# Patient Record
Sex: Female | Born: 1950 | Race: White | Hispanic: No | Marital: Married | Smoking: Former smoker
Health system: Southern US, Community
[De-identification: ages and names within clinical notes are randomized; demographics above are authoritative.]

## PROBLEM LIST (undated history)

## (undated) DIAGNOSIS — E7800 Pure hypercholesterolemia, unspecified: Secondary | ICD-10-CM

## (undated) DIAGNOSIS — M5090 Cervical disc disorder, unspecified, unspecified cervical region: Secondary | ICD-10-CM

## (undated) DIAGNOSIS — R51 Headache: Secondary | ICD-10-CM

## (undated) DIAGNOSIS — F419 Anxiety disorder, unspecified: Secondary | ICD-10-CM

## (undated) DIAGNOSIS — I10 Essential (primary) hypertension: Secondary | ICD-10-CM

## (undated) DIAGNOSIS — K219 Gastro-esophageal reflux disease without esophagitis: Secondary | ICD-10-CM

## (undated) DIAGNOSIS — G894 Chronic pain syndrome: Secondary | ICD-10-CM

## (undated) DIAGNOSIS — G8929 Other chronic pain: Secondary | ICD-10-CM

## (undated) DIAGNOSIS — M797 Fibromyalgia: Secondary | ICD-10-CM

## (undated) DIAGNOSIS — J449 Chronic obstructive pulmonary disease, unspecified: Secondary | ICD-10-CM

## (undated) DIAGNOSIS — M1990 Unspecified osteoarthritis, unspecified site: Secondary | ICD-10-CM

## (undated) DIAGNOSIS — R519 Headache, unspecified: Secondary | ICD-10-CM

## (undated) DIAGNOSIS — K589 Irritable bowel syndrome without diarrhea: Secondary | ICD-10-CM

## (undated) HISTORY — DX: Irritable bowel syndrome, unspecified: K58.9

## (undated) HISTORY — DX: Unspecified osteoarthritis, unspecified site: M19.90

## (undated) HISTORY — DX: Gastro-esophageal reflux disease without esophagitis: K21.9

## (undated) HISTORY — DX: Pure hypercholesterolemia, unspecified: E78.00

## (undated) HISTORY — PX: CARPAL TUNNEL RELEASE: SHX101

## (undated) HISTORY — DX: Chronic pain syndrome: G89.4

## (undated) HISTORY — DX: Essential (primary) hypertension: I10

## (undated) HISTORY — DX: Headache, unspecified: R51.9

## (undated) HISTORY — DX: Chronic obstructive pulmonary disease, unspecified: J44.9

## (undated) HISTORY — DX: Fibromyalgia: M79.7

## (undated) HISTORY — DX: Other chronic pain: G89.29

## (undated) HISTORY — DX: Anxiety disorder, unspecified: F41.9

## (undated) HISTORY — DX: Headache: R51

## (undated) HISTORY — DX: Cervical disc disorder, unspecified, unspecified cervical region: M50.90

## (undated) HISTORY — PX: VAGINAL HYSTERECTOMY: SUR661

---

## 1997-08-06 ENCOUNTER — Encounter (HOSPITAL_COMMUNITY): Admission: RE | Admit: 1997-08-06 | Discharge: 1997-11-04 | Payer: Self-pay | Admitting: Internal Medicine

## 1997-08-06 ENCOUNTER — Encounter (HOSPITAL_COMMUNITY): Admission: RE | Admit: 1997-08-06 | Discharge: 1997-08-06 | Payer: Self-pay | Admitting: Internal Medicine

## 1998-12-22 ENCOUNTER — Encounter: Payer: Self-pay | Admitting: Gastroenterology

## 1999-03-09 HISTORY — PX: VESICOVAGINAL FISTULA CLOSURE W/ TAH: SUR271

## 2000-03-08 HISTORY — PX: ANTERIOR CERVICAL DECOMP/DISCECTOMY FUSION: SHX1161

## 2000-03-09 ENCOUNTER — Encounter: Admission: RE | Admit: 2000-03-09 | Discharge: 2000-06-07 | Payer: Self-pay | Admitting: *Deleted

## 2001-02-20 ENCOUNTER — Inpatient Hospital Stay (HOSPITAL_COMMUNITY): Admission: RE | Admit: 2001-02-20 | Discharge: 2001-02-21 | Payer: Self-pay | Admitting: Orthopaedic Surgery

## 2002-02-05 ENCOUNTER — Ambulatory Visit (HOSPITAL_COMMUNITY): Admission: RE | Admit: 2002-02-05 | Discharge: 2002-02-05 | Payer: Self-pay | Admitting: Neurosurgery

## 2002-02-05 ENCOUNTER — Encounter: Payer: Self-pay | Admitting: Neurosurgery

## 2004-02-24 ENCOUNTER — Ambulatory Visit: Payer: Self-pay | Admitting: Pulmonary Disease

## 2004-03-30 ENCOUNTER — Ambulatory Visit: Payer: Self-pay | Admitting: Pulmonary Disease

## 2004-06-17 ENCOUNTER — Ambulatory Visit: Payer: Self-pay | Admitting: Pulmonary Disease

## 2004-08-10 ENCOUNTER — Ambulatory Visit: Payer: Self-pay | Admitting: Pulmonary Disease

## 2004-11-13 ENCOUNTER — Ambulatory Visit: Payer: Self-pay | Admitting: Pulmonary Disease

## 2004-12-24 ENCOUNTER — Ambulatory Visit: Payer: Self-pay | Admitting: Pulmonary Disease

## 2005-01-05 ENCOUNTER — Ambulatory Visit: Payer: Self-pay | Admitting: Gastroenterology

## 2005-01-07 ENCOUNTER — Ambulatory Visit: Payer: Self-pay | Admitting: Gastroenterology

## 2005-01-26 ENCOUNTER — Ambulatory Visit (HOSPITAL_COMMUNITY): Admission: RE | Admit: 2005-01-26 | Discharge: 2005-01-26 | Payer: Self-pay | Admitting: Pulmonary Disease

## 2005-04-23 ENCOUNTER — Ambulatory Visit: Payer: Self-pay | Admitting: Pulmonary Disease

## 2005-12-08 ENCOUNTER — Ambulatory Visit: Payer: Self-pay | Admitting: Pulmonary Disease

## 2005-12-14 ENCOUNTER — Ambulatory Visit: Payer: Self-pay | Admitting: Pulmonary Disease

## 2005-12-14 LAB — CONVERTED CEMR LAB
BUN: 7 mg/dL (ref 6–23)
CO2: 32 meq/L (ref 19–32)
Calcium: 9.1 mg/dL (ref 8.4–10.5)
Creatinine, Ser: 0.8 mg/dL (ref 0.4–1.2)

## 2006-06-09 ENCOUNTER — Ambulatory Visit: Payer: Self-pay | Admitting: Pulmonary Disease

## 2006-06-09 LAB — CONVERTED CEMR LAB
AST: 27 units/L (ref 0–37)
Albumin: 3.7 g/dL (ref 3.5–5.2)
Bilirubin, Direct: 0.1 mg/dL (ref 0.0–0.3)
Chloride: 93 meq/L — ABNORMAL LOW (ref 96–112)
Cholesterol: 173 mg/dL (ref 0–200)
Creatinine, Ser: 0.8 mg/dL (ref 0.4–1.2)
GFR calc non Af Amer: 78 mL/min
Glucose, Bld: 102 mg/dL — ABNORMAL HIGH (ref 70–99)
HDL: 63 mg/dL (ref >39.0)
LDL Cholesterol: 71 mg/dL (ref 0–99)
Potassium: 5.4 meq/L — ABNORMAL HIGH (ref 3.5–5.1)
Sodium: 132 meq/L — ABNORMAL LOW (ref 135–145)
Total Bilirubin: 0.5 mg/dL (ref 0.3–1.2)
Triglycerides: 194 mg/dL — ABNORMAL HIGH (ref 0–149)
VLDL: 39 mg/dL (ref 0–40)

## 2007-01-16 ENCOUNTER — Ambulatory Visit: Payer: Self-pay | Admitting: Pulmonary Disease

## 2007-01-16 LAB — CONVERTED CEMR LAB
Albumin: 3.7 g/dL (ref 3.5–5.2)
Alkaline Phosphatase: 50 units/L (ref 39–117)
BUN: 9 mg/dL (ref 6–23)
Basophils Absolute: 0 10*3/uL (ref 0.0–0.1)
Cholesterol: 124 mg/dL (ref 0–200)
Creatinine, Ser: 0.7 mg/dL (ref 0.4–1.2)
Eosinophils Absolute: 0.1 10*3/uL (ref 0.0–0.6)
GFR calc Af Amer: 111 mL/min
GFR calc non Af Amer: 91 mL/min
HCT: 41.4 % (ref 36.0–46.0)
HDL: 49.6 mg/dL (ref >39.0)
Hemoglobin: 14.1 g/dL (ref 12.0–15.0)
LDL Cholesterol: 53 mg/dL (ref 0–99)
MCHC: 34.1 g/dL (ref 30.0–36.0)
MCV: 96 fL (ref 78.0–100.0)
Monocytes Absolute: 0.6 10*3/uL (ref 0.2–0.7)
Monocytes Relative: 8.5 % (ref 3.0–11.0)
Neutro Abs: 3.6 10*3/uL (ref 1.4–7.7)
Neutrophils Relative %: 53.5 % (ref 43.0–77.0)
Potassium: 4.8 meq/L (ref 3.5–5.1)
Sodium: 134 meq/L — ABNORMAL LOW (ref 135–145)
TSH: 1.07 microintl units/mL (ref 0.35–5.50)
Total Bilirubin: 0.6 mg/dL (ref 0.3–1.2)

## 2007-01-19 DIAGNOSIS — K219 Gastro-esophageal reflux disease without esophagitis: Secondary | ICD-10-CM

## 2007-01-19 DIAGNOSIS — G894 Chronic pain syndrome: Secondary | ICD-10-CM | POA: Insufficient documentation

## 2007-01-19 DIAGNOSIS — I10 Essential (primary) hypertension: Secondary | ICD-10-CM | POA: Insufficient documentation

## 2007-01-19 DIAGNOSIS — E7800 Pure hypercholesterolemia, unspecified: Secondary | ICD-10-CM

## 2007-01-31 ENCOUNTER — Ambulatory Visit: Payer: Self-pay | Admitting: Pulmonary Disease

## 2007-06-20 ENCOUNTER — Telehealth (INDEPENDENT_AMBULATORY_CARE_PROVIDER_SITE_OTHER): Payer: Self-pay | Admitting: *Deleted

## 2007-07-20 ENCOUNTER — Encounter: Payer: Self-pay | Admitting: Pulmonary Disease

## 2007-07-21 ENCOUNTER — Ambulatory Visit: Payer: Self-pay | Admitting: Pulmonary Disease

## 2007-07-21 DIAGNOSIS — R519 Headache, unspecified: Secondary | ICD-10-CM | POA: Insufficient documentation

## 2007-07-21 DIAGNOSIS — M797 Fibromyalgia: Secondary | ICD-10-CM

## 2007-07-21 DIAGNOSIS — F411 Generalized anxiety disorder: Secondary | ICD-10-CM

## 2007-07-21 DIAGNOSIS — M1990 Unspecified osteoarthritis, unspecified site: Secondary | ICD-10-CM | POA: Insufficient documentation

## 2007-07-21 DIAGNOSIS — J449 Chronic obstructive pulmonary disease, unspecified: Secondary | ICD-10-CM

## 2007-07-21 DIAGNOSIS — M5030 Other cervical disc degeneration, unspecified cervical region: Secondary | ICD-10-CM

## 2007-07-21 DIAGNOSIS — K589 Irritable bowel syndrome without diarrhea: Secondary | ICD-10-CM

## 2007-07-21 DIAGNOSIS — R51 Headache: Secondary | ICD-10-CM

## 2007-08-08 ENCOUNTER — Encounter: Payer: Self-pay | Admitting: Pulmonary Disease

## 2007-08-10 ENCOUNTER — Telehealth (INDEPENDENT_AMBULATORY_CARE_PROVIDER_SITE_OTHER): Payer: Self-pay | Admitting: *Deleted

## 2007-08-16 ENCOUNTER — Telehealth (INDEPENDENT_AMBULATORY_CARE_PROVIDER_SITE_OTHER): Payer: Self-pay | Admitting: *Deleted

## 2007-09-29 ENCOUNTER — Telehealth (INDEPENDENT_AMBULATORY_CARE_PROVIDER_SITE_OTHER): Payer: Self-pay | Admitting: *Deleted

## 2007-10-17 ENCOUNTER — Encounter: Payer: Self-pay | Admitting: Pulmonary Disease

## 2007-10-27 ENCOUNTER — Telehealth: Payer: Self-pay | Admitting: Pulmonary Disease

## 2007-12-07 ENCOUNTER — Telehealth (INDEPENDENT_AMBULATORY_CARE_PROVIDER_SITE_OTHER): Payer: Self-pay | Admitting: *Deleted

## 2008-02-22 ENCOUNTER — Encounter: Payer: Self-pay | Admitting: Adult Health

## 2008-02-22 ENCOUNTER — Ambulatory Visit: Payer: Self-pay | Admitting: Pulmonary Disease

## 2008-02-28 LAB — CONVERTED CEMR LAB
ALT: 19 units/L (ref 0–35)
AST: 30 units/L (ref 0–37)
Alkaline Phosphatase: 61 units/L (ref 39–117)
Bilirubin Urine: NEGATIVE
Bilirubin, Direct: 0.1 mg/dL (ref 0.0–0.3)
CO2: 36 meq/L — ABNORMAL HIGH (ref 19–32)
Calcium: 8.7 mg/dL (ref 8.4–10.5)
Chloride: 92 meq/L — ABNORMAL LOW (ref 96–112)
Cholesterol: 166 mg/dL (ref 0–200)
Glucose, Bld: 90 mg/dL (ref 70–99)
Hemoglobin: 15 g/dL (ref 12.0–15.0)
Leukocytes, UA: NEGATIVE
Lymphocytes Relative: 35.7 % (ref 12.0–46.0)
Monocytes Relative: 7.6 % (ref 3.0–12.0)
Neutro Abs: 3.3 10*3/uL (ref 1.4–7.7)
Neutrophils Relative %: 55.2 % (ref 43.0–77.0)
Nitrite: NEGATIVE
Potassium: 5.2 meq/L — ABNORMAL HIGH (ref 3.5–5.1)
RDW: 12.9 % (ref 11.5–14.6)
Sodium: 132 meq/L — ABNORMAL LOW (ref 135–145)
Specific Gravity, Urine: 1.01 (ref 1.000–1.03)
Total Bilirubin: 0.5 mg/dL (ref 0.3–1.2)
Total Protein: 6.5 g/dL (ref 6.0–8.3)
Triglycerides: 198 mg/dL — ABNORMAL HIGH (ref 0–149)
Urobilinogen, UA: 0.2 (ref 0.0–1.0)
VLDL: 40 mg/dL (ref 0–40)
pH: 5.5 (ref 5.0–8.0)

## 2008-03-12 ENCOUNTER — Telehealth (INDEPENDENT_AMBULATORY_CARE_PROVIDER_SITE_OTHER): Payer: Self-pay | Admitting: *Deleted

## 2008-04-02 ENCOUNTER — Encounter: Payer: Self-pay | Admitting: Adult Health

## 2008-04-02 ENCOUNTER — Ambulatory Visit: Payer: Self-pay | Admitting: Internal Medicine

## 2008-04-02 LAB — CONVERTED CEMR LAB
BUN: 7 mg/dL (ref 6–23)
CO2: 34 meq/L — ABNORMAL HIGH (ref 19–32)
Calcium: 9 mg/dL (ref 8.4–10.5)
Chloride: 93 meq/L — ABNORMAL LOW (ref 96–112)
Creatinine, Ser: 0.6 mg/dL (ref 0.4–1.2)
Glucose, Bld: 98 mg/dL (ref 70–99)

## 2008-04-04 ENCOUNTER — Telehealth (INDEPENDENT_AMBULATORY_CARE_PROVIDER_SITE_OTHER): Payer: Self-pay | Admitting: *Deleted

## 2008-05-09 ENCOUNTER — Telehealth (INDEPENDENT_AMBULATORY_CARE_PROVIDER_SITE_OTHER): Payer: Self-pay | Admitting: *Deleted

## 2008-06-19 ENCOUNTER — Ambulatory Visit: Payer: Self-pay | Admitting: Pulmonary Disease

## 2008-06-20 DIAGNOSIS — E559 Vitamin D deficiency, unspecified: Secondary | ICD-10-CM | POA: Insufficient documentation

## 2008-08-02 ENCOUNTER — Telehealth: Payer: Self-pay | Admitting: Pulmonary Disease

## 2008-08-06 ENCOUNTER — Telehealth (INDEPENDENT_AMBULATORY_CARE_PROVIDER_SITE_OTHER): Payer: Self-pay | Admitting: *Deleted

## 2008-08-07 ENCOUNTER — Telehealth (INDEPENDENT_AMBULATORY_CARE_PROVIDER_SITE_OTHER): Payer: Self-pay | Admitting: *Deleted

## 2008-08-12 ENCOUNTER — Ambulatory Visit: Payer: Self-pay | Admitting: Pulmonary Disease

## 2008-08-13 LAB — CONVERTED CEMR LAB
BUN: 5 mg/dL — ABNORMAL LOW (ref 6–23)
CO2: 37 meq/L — ABNORMAL HIGH (ref 19–32)
Calcium: 8.5 mg/dL (ref 8.4–10.5)
GFR calc non Af Amer: 133.62 mL/min (ref >60)
Glucose, Bld: 81 mg/dL (ref 70–99)

## 2008-10-25 ENCOUNTER — Telehealth (INDEPENDENT_AMBULATORY_CARE_PROVIDER_SITE_OTHER): Payer: Self-pay | Admitting: *Deleted

## 2008-11-25 ENCOUNTER — Telehealth: Payer: Self-pay | Admitting: Pulmonary Disease

## 2008-11-29 ENCOUNTER — Telehealth (INDEPENDENT_AMBULATORY_CARE_PROVIDER_SITE_OTHER): Payer: Self-pay | Admitting: *Deleted

## 2008-12-23 ENCOUNTER — Ambulatory Visit: Payer: Self-pay | Admitting: Pulmonary Disease

## 2008-12-26 ENCOUNTER — Telehealth: Payer: Self-pay | Admitting: Pulmonary Disease

## 2009-01-02 ENCOUNTER — Telehealth (INDEPENDENT_AMBULATORY_CARE_PROVIDER_SITE_OTHER): Payer: Self-pay | Admitting: *Deleted

## 2009-01-03 ENCOUNTER — Encounter: Payer: Self-pay | Admitting: Pulmonary Disease

## 2009-01-03 ENCOUNTER — Ambulatory Visit: Payer: Self-pay | Admitting: Cardiovascular Disease

## 2009-01-03 ENCOUNTER — Ambulatory Visit: Payer: Self-pay

## 2009-01-03 ENCOUNTER — Ambulatory Visit (HOSPITAL_COMMUNITY): Admission: RE | Admit: 2009-01-03 | Discharge: 2009-01-03 | Payer: Self-pay | Admitting: Pulmonary Disease

## 2009-01-07 ENCOUNTER — Telehealth: Payer: Self-pay | Admitting: Pulmonary Disease

## 2009-01-09 ENCOUNTER — Telehealth (INDEPENDENT_AMBULATORY_CARE_PROVIDER_SITE_OTHER): Payer: Self-pay | Admitting: *Deleted

## 2009-01-23 ENCOUNTER — Telehealth: Payer: Self-pay | Admitting: Pulmonary Disease

## 2009-03-20 ENCOUNTER — Ambulatory Visit: Payer: Self-pay | Admitting: Pulmonary Disease

## 2009-03-27 LAB — CONVERTED CEMR LAB
ALT: 20 units/L (ref 0–35)
AST: 27 units/L (ref 0–37)
Alkaline Phosphatase: 61 units/L (ref 39–117)
BUN: 17 mg/dL (ref 6–23)
Basophils Absolute: 0 10*3/uL (ref 0.0–0.1)
Chloride: 92 meq/L — ABNORMAL LOW (ref 96–112)
Eosinophils Absolute: 0.2 10*3/uL (ref 0.0–0.7)
GFR calc non Af Amer: 53.68 mL/min (ref >60)
Glucose, Bld: 93 mg/dL (ref 70–99)
HCT: 47 % — ABNORMAL HIGH (ref 36.0–46.0)
HDL: 54.5 mg/dL (ref >39.00)
Lymphs Abs: 1.9 10*3/uL (ref 0.7–4.0)
MCV: 102.7 fL — ABNORMAL HIGH (ref 78.0–100.0)
Monocytes Absolute: 0.6 10*3/uL (ref 0.1–1.0)
Neutrophils Relative %: 64 % (ref 43.0–77.0)
Platelets: 170 10*3/uL (ref 150.0–400.0)
Potassium: 5.2 meq/L — ABNORMAL HIGH (ref 3.5–5.1)
Pro B Natriuretic peptide (BNP): 89 pg/mL (ref 0.0–100.0)
RDW: 12.7 % (ref 11.5–14.6)
Sodium: 132 meq/L — ABNORMAL LOW (ref 135–145)
TSH: 2.98 microintl units/mL (ref 0.35–5.50)
Total Bilirubin: 1.1 mg/dL (ref 0.3–1.2)
VLDL: 45 mg/dL — ABNORMAL HIGH (ref 0.0–40.0)
WBC: 7.3 10*3/uL (ref 4.5–10.5)

## 2009-03-31 ENCOUNTER — Telehealth (INDEPENDENT_AMBULATORY_CARE_PROVIDER_SITE_OTHER): Payer: Self-pay | Admitting: *Deleted

## 2009-04-07 ENCOUNTER — Encounter: Payer: Self-pay | Admitting: Pulmonary Disease

## 2009-04-17 ENCOUNTER — Ambulatory Visit: Payer: Self-pay | Admitting: Emergency Medicine

## 2009-04-17 ENCOUNTER — Telehealth (INDEPENDENT_AMBULATORY_CARE_PROVIDER_SITE_OTHER): Payer: Self-pay | Admitting: *Deleted

## 2009-04-17 DIAGNOSIS — I2723 Pulmonary hypertension due to lung diseases and hypoxia: Secondary | ICD-10-CM

## 2009-04-17 DIAGNOSIS — J449 Chronic obstructive pulmonary disease, unspecified: Secondary | ICD-10-CM

## 2009-04-18 ENCOUNTER — Telehealth: Payer: Self-pay | Admitting: Emergency Medicine

## 2009-04-21 LAB — CONVERTED CEMR LAB: Scleroderma (Scl-70) (ENA) Antibody, IgG: <0.2 (ref <1.0)

## 2009-04-23 ENCOUNTER — Encounter: Payer: Self-pay | Admitting: Pulmonary Disease

## 2009-04-23 ENCOUNTER — Telehealth (INDEPENDENT_AMBULATORY_CARE_PROVIDER_SITE_OTHER): Payer: Self-pay | Admitting: *Deleted

## 2009-04-23 ENCOUNTER — Ambulatory Visit: Payer: Self-pay | Admitting: Emergency Medicine

## 2009-04-24 ENCOUNTER — Encounter: Payer: Self-pay | Admitting: Internal Medicine

## 2009-04-24 ENCOUNTER — Encounter: Payer: Self-pay | Admitting: Emergency Medicine

## 2009-04-25 ENCOUNTER — Telehealth: Payer: Self-pay | Admitting: Internal Medicine

## 2009-04-28 ENCOUNTER — Telehealth (INDEPENDENT_AMBULATORY_CARE_PROVIDER_SITE_OTHER): Payer: Self-pay | Admitting: *Deleted

## 2009-05-01 ENCOUNTER — Telehealth: Payer: Self-pay | Admitting: Pulmonary Disease

## 2009-05-02 ENCOUNTER — Telehealth (INDEPENDENT_AMBULATORY_CARE_PROVIDER_SITE_OTHER): Payer: Self-pay | Admitting: *Deleted

## 2009-05-05 ENCOUNTER — Telehealth: Payer: Self-pay | Admitting: Pulmonary Disease

## 2009-05-09 ENCOUNTER — Telehealth (INDEPENDENT_AMBULATORY_CARE_PROVIDER_SITE_OTHER): Payer: Self-pay | Admitting: *Deleted

## 2009-05-19 ENCOUNTER — Encounter: Payer: Self-pay | Admitting: Emergency Medicine

## 2009-05-27 ENCOUNTER — Ambulatory Visit: Payer: Self-pay | Admitting: Emergency Medicine

## 2009-06-18 ENCOUNTER — Telehealth (INDEPENDENT_AMBULATORY_CARE_PROVIDER_SITE_OTHER): Payer: Self-pay | Admitting: *Deleted

## 2009-06-30 ENCOUNTER — Telehealth (INDEPENDENT_AMBULATORY_CARE_PROVIDER_SITE_OTHER): Payer: Self-pay | Admitting: *Deleted

## 2009-07-11 ENCOUNTER — Ambulatory Visit: Payer: Self-pay | Admitting: Pulmonary Disease

## 2009-07-13 LAB — CONVERTED CEMR LAB
Basophils Relative: 0.4 % (ref 0.0–3.0)
CO2: 34 meq/L — ABNORMAL HIGH (ref 19–32)
Calcium: 9.2 mg/dL (ref 8.4–10.5)
GFR calc non Af Amer: 100.19 mL/min (ref >60)
Hemoglobin: 14.9 g/dL (ref 12.0–15.0)
Lymphocytes Relative: 34.8 % (ref 12.0–46.0)
Monocytes Relative: 9.2 % (ref 3.0–12.0)
Neutro Abs: 3.8 10*3/uL (ref 1.4–7.7)
Potassium: 4.9 meq/L (ref 3.5–5.1)
RBC: 4.63 M/uL (ref 3.87–5.11)
Sodium: 134 meq/L — ABNORMAL LOW (ref 135–145)

## 2009-07-16 ENCOUNTER — Encounter: Admission: RE | Admit: 2009-07-16 | Discharge: 2009-07-16 | Payer: Self-pay | Admitting: Pulmonary Disease

## 2009-07-21 ENCOUNTER — Telehealth (INDEPENDENT_AMBULATORY_CARE_PROVIDER_SITE_OTHER): Payer: Self-pay | Admitting: *Deleted

## 2009-07-28 ENCOUNTER — Telehealth: Payer: Self-pay | Admitting: Pulmonary Disease

## 2009-08-17 ENCOUNTER — Ambulatory Visit (HOSPITAL_BASED_OUTPATIENT_CLINIC_OR_DEPARTMENT_OTHER): Admission: RE | Admit: 2009-08-17 | Discharge: 2009-08-17 | Payer: Self-pay | Admitting: Pulmonary Disease

## 2009-08-17 ENCOUNTER — Encounter: Payer: Self-pay | Admitting: Pulmonary Disease

## 2009-08-31 ENCOUNTER — Ambulatory Visit: Payer: Self-pay | Admitting: Pulmonary Disease

## 2009-09-01 ENCOUNTER — Ambulatory Visit: Payer: Self-pay | Admitting: Pulmonary Disease

## 2009-09-01 ENCOUNTER — Telehealth: Payer: Self-pay | Admitting: Internal Medicine

## 2009-09-11 ENCOUNTER — Encounter: Payer: Self-pay | Admitting: Internal Medicine

## 2009-09-30 ENCOUNTER — Ambulatory Visit: Payer: Self-pay | Admitting: Internal Medicine

## 2009-09-30 LAB — CONVERTED CEMR LAB
BUN: 14 mg/dL (ref 6–23)
Basophils Relative: 0.4 % (ref 0.0–3.0)
CO2: 35 meq/L — ABNORMAL HIGH (ref 19–32)
Chloride: 97 meq/L (ref 96–112)
Creatinine, Ser: 0.7 mg/dL (ref 0.4–1.2)
Eosinophils Absolute: 0.2 10*3/uL (ref 0.0–0.7)
Glucose, Bld: 126 mg/dL — ABNORMAL HIGH (ref 70–99)
MCHC: 34.3 g/dL (ref 30.0–36.0)
MCV: 95.6 fL (ref 78.0–100.0)
Monocytes Absolute: 0.4 10*3/uL (ref 0.1–1.0)
Neutrophils Relative %: 58.8 % (ref 43.0–77.0)
Platelets: 255 10*3/uL (ref 150.0–400.0)
RBC: 4.2 M/uL (ref 3.87–5.11)

## 2009-10-03 ENCOUNTER — Ambulatory Visit: Payer: Self-pay | Admitting: Internal Medicine

## 2009-10-03 ENCOUNTER — Encounter: Payer: Self-pay | Admitting: Pulmonary Disease

## 2009-10-03 ENCOUNTER — Inpatient Hospital Stay (HOSPITAL_BASED_OUTPATIENT_CLINIC_OR_DEPARTMENT_OTHER): Admission: RE | Admit: 2009-10-03 | Discharge: 2009-10-03 | Payer: Self-pay | Admitting: Internal Medicine

## 2009-10-03 ENCOUNTER — Telehealth (INDEPENDENT_AMBULATORY_CARE_PROVIDER_SITE_OTHER): Payer: Self-pay | Admitting: *Deleted

## 2009-10-09 ENCOUNTER — Ambulatory Visit: Payer: Self-pay | Admitting: Emergency Medicine

## 2009-10-14 ENCOUNTER — Telehealth (INDEPENDENT_AMBULATORY_CARE_PROVIDER_SITE_OTHER): Payer: Self-pay | Admitting: *Deleted

## 2009-10-15 ENCOUNTER — Telehealth: Payer: Self-pay | Admitting: Pulmonary Disease

## 2009-10-15 ENCOUNTER — Telehealth (INDEPENDENT_AMBULATORY_CARE_PROVIDER_SITE_OTHER): Payer: Self-pay | Admitting: *Deleted

## 2009-10-24 ENCOUNTER — Telehealth (INDEPENDENT_AMBULATORY_CARE_PROVIDER_SITE_OTHER): Payer: Self-pay | Admitting: *Deleted

## 2009-11-12 ENCOUNTER — Telehealth (INDEPENDENT_AMBULATORY_CARE_PROVIDER_SITE_OTHER): Payer: Self-pay | Admitting: *Deleted

## 2009-12-23 ENCOUNTER — Telehealth (INDEPENDENT_AMBULATORY_CARE_PROVIDER_SITE_OTHER): Payer: Self-pay | Admitting: *Deleted

## 2009-12-24 ENCOUNTER — Encounter: Payer: Self-pay | Admitting: Emergency Medicine

## 2009-12-26 ENCOUNTER — Telehealth (INDEPENDENT_AMBULATORY_CARE_PROVIDER_SITE_OTHER): Payer: Self-pay | Admitting: *Deleted

## 2010-01-07 ENCOUNTER — Telehealth (INDEPENDENT_AMBULATORY_CARE_PROVIDER_SITE_OTHER): Payer: Self-pay | Admitting: *Deleted

## 2010-01-20 ENCOUNTER — Encounter: Payer: Self-pay | Admitting: Pulmonary Disease

## 2010-01-23 ENCOUNTER — Ambulatory Visit: Payer: Self-pay | Admitting: Emergency Medicine

## 2010-01-23 ENCOUNTER — Telehealth: Payer: Self-pay | Admitting: Adult Health

## 2010-01-23 DIAGNOSIS — J209 Acute bronchitis, unspecified: Secondary | ICD-10-CM | POA: Insufficient documentation

## 2010-02-04 ENCOUNTER — Telehealth: Payer: Self-pay | Admitting: Pulmonary Disease

## 2010-02-06 ENCOUNTER — Telehealth: Payer: Self-pay | Admitting: Pulmonary Disease

## 2010-02-26 ENCOUNTER — Telehealth (INDEPENDENT_AMBULATORY_CARE_PROVIDER_SITE_OTHER): Payer: Self-pay | Admitting: *Deleted

## 2010-03-25 ENCOUNTER — Ambulatory Visit
Admission: RE | Admit: 2010-03-25 | Discharge: 2010-03-25 | Payer: Self-pay | Source: Home / Self Care | Attending: Pulmonary Disease | Admitting: Pulmonary Disease

## 2010-03-27 ENCOUNTER — Telehealth: Payer: Self-pay | Admitting: Pulmonary Disease

## 2010-03-28 ENCOUNTER — Encounter: Payer: Self-pay | Admitting: Pulmonary Disease

## 2010-03-29 ENCOUNTER — Encounter: Payer: Self-pay | Admitting: Neurosurgery

## 2010-04-06 ENCOUNTER — Ambulatory Visit: Admit: 2010-04-06 | Payer: Self-pay | Admitting: Pulmonary Disease

## 2010-04-07 NOTE — Letter (Signed)
Summary: [facility]  [facility]N/[facility]   Imported By: Bubba Hales 04/29/2009 09:56:45  _____________________________________________________________________  External Attachment:    Type:   Image     Comment:   External Document

## 2010-04-07 NOTE — Progress Notes (Signed)
Summary: rx  Phone Note Call from Patient Call back at Home Phone [phone]   Caller: Patient Call For: Katianne Barre Reason for Call: Talk to Nurse Summary of Call: pt said that Vicodin was what was called in  5/553m.  Tablet is smaller and white, instead if Hydrocodone 10/6554m- Is this what SN wants her to take? Initial call taken by: AnZigmund Gottron May 05, 2009 4:25 PM  Follow-up for Phone Call        Spoke with the pt pharmacy and they staed what they pulled off the voicemail was 5/500. I advised next refill should be 10-650. they have corrected this. Pt already picked up Vicodin 5-500 so I advised to take thsi med until gone and then she can goback to 10-650. Advised if there was any problems with med to  call. JeRandolph BingMA  May 05, 2009 4:40 PM'

## 2010-04-07 NOTE — Medication Information (Signed)
Summary: Omeprazole/[facility]  Omeprazole/[facility]   Imported By: Phillis Knack 04/10/2009 13:30:58  _____________________________________________________________________  External Attachment:    Type:   Image     Comment:   External Document

## 2010-04-07 NOTE — Miscellaneous (Signed)
Summary: Orders Update   Clinical Lists Changes  Orders: Added new Service order of Est. Patient Level IV (49971) - Signed

## 2010-04-07 NOTE — Progress Notes (Signed)
Summary: pt has an apt today/ wants to talk to TD  Phone Note Call from Patient Call back at Home Phone [phone]   Caller: Patient Call For: byrum Summary of Call: patient wants to talk to tammy davis about what her apt was about when she seen dr byrum. she said she doesnt understand anything.  Initial call taken by: Adin Hector,  April 23, 2009 12:26 PM  Follow-up for Phone Call        [facility] Tilden Dome  April 23, 2009 1:36 PM   Additional Follow-up for Phone Call Additional follow up Details #1::        Pt came in for PFT-spoke with pt about her concerns for Cath; called and spoke with Nira Conn regarding this matter. Setting up appt.Pound  April 23, 2009 3:52 PM

## 2010-04-07 NOTE — Progress Notes (Signed)
Summary: prescript  Phone Note Call from Patient   Caller: Patient Call For: nadel Summary of Call: need refill for albuterol sulfate [facility] Initial call taken by: Gustavus Bryant,  June 18, 2009 4:14 PM  Follow-up for Phone Call        called spoke with patient. advised rx has been sent to her pharmacy. Parke Poisson CNA  June 18, 2009 4:17 PM     Prescriptions: PROAIR HFA 108 (90 BASE) MCG/ACT  AERS (ALBUTEROL SULFATE) inhale 2 puffs every 4 hours as needed  #1 x 6   Entered by:   Parke Poisson CNA   Authorized by:   Noralee Space MD   Signed by:   Parke Poisson CNA on 06/18/2009   Method used:   Electronically to        [facility] (retail)       [address]       [place], [place]  [address]       Ph: [phone]       Fax: [phone]   RxID:   [id number]

## 2010-04-07 NOTE — Assessment & Plan Note (Signed)
Summary: Acute NP office visit - bronchitis   Primary Provider/Referring Provider:  Dr. Teressa Lower  CC:  prod cough with brown mucus, chest congestion, and wheezing x1.5weeks.  History of Present Illness: 60 y/o WF, positive tobacco and COPD, anxiety d/o, chronic pain,  history of HTN, and hyperlipidemia, and anxiety, chronic reflux. Seen in past by Dr Doreatha Lew 2002 and found to have PASP  ~ 45-50 mmHg on TTE. Repeat TTE with estimated PASP 70 mmHg. She does report exposure to dexfenfluromine for about 3 months (was not on fen-phen) in early 2000's (she can't remember when). ANA negative.   ROS - has been very active, no real exertional SOB. She does c/o esophageal spasms, GERD symptoms. Still smokes and takes Advair, feels that it helps her. Denies any CP, no syncope of pre-syncope.   ROV 05/27/09 -- PFT's 04/24/09 showed severe obstruction + probable restriction. She never wore her supplimental O2 because she felt better.  She cancelled her R heart cath. Continues to take Advair. She has cut her cigarettes down to 1 -2 cigarettes a day. We rechecked autoimmune labs - ANA, aScl70, a-ds-DNA negative. RF borderline positive. She mentions today that she had formerly been adderal, stopped it many mo ago, wonders if this had anything to do with her breathing problems. She feels that her exertional tolerance is good, not sure she needs O2 or a R heart cath.    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 Since last visit, she has decided to undergo R heart cath (7/29) showing mean PAP 41, PAOP 10. She quit smoking in May 2011! Tells me that she is using O2 with some exertion but not all. She doesn't feel very limited by her breathing. She is able to do most activities, but has curtailed some things - does chores but slowly, some SOB with climbing several flights of stairs.   January 23, 2010--Presents for an acute office visit. Complains of productive cough with  brown mucus, chest congestion, wheezing  over last 2 weeks . OTC not helping. Denies chest pain,  orthopnea, hemoptysis, fever, n/v/d, edema, headache.   Medications Prior to Update: 1)  Oxygen .... At Bedtime 2)  Advair Diskus 250-50 Mcg/dose  Misc (Fluticasone-Salmeterol) .... Take One Puff Two Times A Day  Rinse Mouth After Each Use 3)  Spiriva Handihaler 18 Mcg Caps (Tiotropium Bromide Monohydrate) .... Inhale Contents of One Capsule Daily Via Handihaler. 4)  Mucinex Maximum Strength 1200 Mg Xr12h-Tab (Guaifenesin) .... Take 1 Tab By Mouth Two Times A Day W/ Plenty of Fluids.Marland KitchenMarland Kitchen 5)  Atenolol 50 Mg  Tabs (Atenolol) .... Take 1 Tablet By Mouth Once A Day... 6)  Vytorin 10-80 Mg Tabs (Ezetimibe-Simvastatin) .... Take One Tablet By Mouth At Bedtime 7)  Omeprazole 20 Mg Cpdr (Omeprazole) .Marland Kitchen.. 1 Once Daily 8)  Premarin 0.625 Mg  Tabs (Estrogens Conjugated) .... Take 1 Tablet By Mouth Once A Day 9)  Hydrocodone-Acetaminophen 10-650 Mg  Tabs (Hydrocodone-Acetaminophen) .... Take 1 Tablet Every 6 Hrs As Needed Pain, Not To Exceed 4 Tabs Per Day. (No Early Refills). 10)  Norpramin 100 Mg Tabs (Desipramine Hcl) .Marland Kitchen.. 1 By Mouth At Bedtime 11)  Alprazolam 0.5 Mg  Tabs (Alprazolam) .... Take 1/2 To 1 Tab By Mouth Three Times A Day, Not To Exceed 3 Pills Per Day. 12)  Ambien 10 Mg  Tabs (Zolpidem Tartrate) .... Take 1/2 To 1 Tab By Mouth At Bedtime As Needed For Sleep 13)  Retin-A  0.025 % Crea (Tretinoin) .... Apply As Directed 14)  Augmentin 875-125 Mg Tabs (Amoxicillin-Pot Clavulanate) .... Take 1 Tab By Mouth Two Times A Day Til Gone... 15)  Proair Hfa 108 (90 Base) Mcg/act  Aers (Albuterol Sulfate) .... Inhale 2 Puffs Every 4 Hours As Needed  Current Medications (verified): 1)  Oxygen .... At Bedtime 2)  Advair Diskus 250-50 Mcg/dose  Misc (Fluticasone-Salmeterol) .... Take One Puff Two Times A Day  Rinse Mouth After Each Use 3)  Spiriva Handihaler 18 Mcg Caps (Tiotropium Bromide Monohydrate) ....  Inhale Contents of One Capsule Daily Via Handihaler. 4)  Proair Hfa 108 (90 Base) Mcg/act  Aers (Albuterol Sulfate) .... Inhale 2 Puffs Every 4 Hours As Needed 5)  Mucinex Maximum Strength 1200 Mg Xr12h-Tab (Guaifenesin) .... Take 1 Tab By Mouth Two Times A Day W/ Plenty of Fluids.Marland KitchenMarland Kitchen 6)  Atenolol 50 Mg  Tabs (Atenolol) .... Take 1 Tablet By Mouth Once A Day... 7)  Vytorin 10-80 Mg Tabs (Ezetimibe-Simvastatin) .... Take One Tablet By Mouth At Bedtime 8)  Omeprazole 20 Mg Cpdr (Omeprazole) .Marland Kitchen.. 1 Once Daily 9)  Premarin 0.625 Mg  Tabs (Estrogens Conjugated) .... Take 1 Tablet By Mouth Once A Day 10)  Hydrocodone-Acetaminophen 10-650 Mg  Tabs (Hydrocodone-Acetaminophen) .... Take 1 Tablet Every 6 Hrs As Needed Pain, Not To Exceed 4 Tabs Per Day. (No Early Refills). 11)  Norpramin 100 Mg Tabs (Desipramine Hcl) .Marland Kitchen.. 1 By Mouth At Bedtime 12)  Alprazolam 0.5 Mg  Tabs (Alprazolam) .... Take 1/2 To 1 Tab By Mouth Three Times A Day, Not To Exceed 3 Pills Per Day. 13)  Ambien 10 Mg  Tabs (Zolpidem Tartrate) .... Take 1/2 To 1 Tab By Mouth At Bedtime As Needed For Sleep 14)  Retin-A 0.025 % Crea (Tretinoin) .... Apply As Directed  Allergies (verified): No Known Drug Allergies  Past History:  Past Medical History: Last updated: 09/02/09 CHRONIC OBSTRUCTIVE ASTHMA UNSPECIFIED (ICD-493.20) HYPERTENSION (ICD-401.9) HYPERCHOLESTEROLEMIA (ICD-272.0) GERD (ICD-530.81) IRRITABLE BOWEL SYNDROME (ICD-564.1) DEGENERATIVE JOINT DISEASE (ICD-715.90) DISC DISEASE, CERVICAL (ICD-722.4) FIBROMYALGIA (ICD-729.1) VITAMIN D DEFICIENCY (ICD-268.9) CHRONIC PAIN SYNDROME (ICD-338.4) HEADACHE, CHRONIC (ICD-784.0) ANXIETY DISORDER (ICD-300.00)  Past Surgical History: Last updated: 2009-09-02 S/P hysterectomy in 2001 S/P ant cervical discectomy & fusion by DrYates in 2002 S/P bilat carpal tunnel releases by DrWeingold  Family History: Last updated: 2009-09-02 Father died age 47 ?Mesothelioma- exposed to ABomb  in [employer] & asbestos is Teacher, 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 Mother died age 89 w/ heart disease ([place]).  Social History: Last updated: 09/02/09 Married No children Patient is a current smoker. Smokes 5 cig a day-      -states she quit 07/11/09! Social alcohol  Disabled, formerly worked Data processing manager.  Risk Factors: Smoking Status: quit (09-02-2009) Packs/Day: 0.5 (04/17/2009)  Review of Systems      See HPI  Vital Signs:  Patient profile:   60 year old female Height:      63 inches Weight:      170 pounds BMI:     30.22 O2 Sat:      92 % on Room air Temp:     97.8 degrees F oral Pulse rate:   99 / minute BP sitting:   126 / 78  (left arm) Cuff size:   regular  Vitals Entered By: Parke Poisson CNA/MA (January 23, 2010 11:01 AM)  O2 Flow:  Room air CC: prod cough with brown mucus, chest congestion, wheezing x1.5weeks Is Patient Diabetic? No Comments Medications reviewed  with patient Daytime contact number verified with patient. Parke Poisson CNA/MA  January 23, 2010 11:00 AM    Physical Exam  Additional Exam:  WD, WN, 60 y/o WF in NAD... GENERAL:  Alert & oriented; pleasant & cooperative, a bit tangential and distracted HEENT:  [place]/AT, NOSE-clear, THROAT-clear & wnl. NECK:  no JVD; no lymphadenopathy. CHEST:  Distant but clear HEART:  Regular Rhythm; without murmurs/ rubs/ or gallops. ABDOMEN:  Not Examined EXT: without deformities, mild arthritic changes; no varicose veins/ +venous insuffic/ no edema. Mild cyanosis fingers, worse on toes NEURO:  CN's intact;  no focal neuro deficits noted.  DERM:  No lesions noted; no rash      Impression & Recommendations:  Problem # 1:  BRONCHITIS, ACUTE (ICD-466.0)  Augmentin 829m two times a day for 7 days Hydromet 1-2 tsp every 4-6 hr as needed cough/ may make you sleepy avoid hydocodone pills if you take this .  Mucinex DM two times a day as needed cough/congesiton Increase rest and  fluids.  Please contact office for sooner follow up if symptoms do not improve or worsen   The following medications were removed from the medication list:    Augmentin 875-125 Mg Tabs (Amoxicillin-pot clavulanate) ..Marland Kitchen.. Take 1 tab by mouth two times a day til gone... Her updated medication list for this problem includes:    Advair Diskus 250-50 Mcg/dose Misc (Fluticasone-salmeterol) ..Marland Kitchen.. Take one puff two times a day  rinse mouth after each use    Spiriva Handihaler 18 Mcg Caps (Tiotropium bromide monohydrate) ..... Inhale contents of one capsule daily via handihaler.    Mucinex Maximum Strength 1200 Mg Xr12h-tab (Guaifenesin) ..Marland Kitchen.. Take 1 tab by mouth two times a day w/ plenty of fluids...    Proair Hfa 108 (90 Base) Mcg/act Aers (Albuterol sulfate) ..... Inhale 2 puffs every 4 hours as needed    Augmentin 875-125 Mg Tabs (Amoxicillin-pot clavulanate) ..Marland Kitchen.. 1 by mouth two times a day    Hydromet 5-1.5 Mg/552mSyrp (Hydrocodone-homatropine) ...Marland Kitchen. 1-2 tsp every 4-6 hr as needed cough  Orders: Est. Patient Level IV ([phone] Medications Added to Medication List This Visit: 1)  Advair Diskus 250-50 Mcg/dose Misc (Fluticasone-salmeterol) .... Take one puff two times a day  rinse mouth after each use 2)  Augmentin 875-125 Mg Tabs (Amoxicillin-pot clavulanate) ...Marland Kitchen 1 by mouth two times a day 3)  Hydromet 5-1.5 Mg/66m37myrp (Hydrocodone-homatropine) ....Marland Kitchen1-2 tsp every 4-6 hr as needed cough  Complete Medication List: 1)  Oxygen  .... At bedtime 2)  Advair Diskus 250-50 Mcg/dose Misc (Fluticasone-salmeterol) .... Take one puff two times a day  rinse mouth after each use 3)  Spiriva Handihaler 18 Mcg Caps (Tiotropium bromide monohydrate) .... Inhale contents of one capsule daily via handihaler. 4)  Mucinex Maximum Strength 1200 Mg Xr12h-tab (Guaifenesin) .... Take 1 tab by mouth two times a day w/ plenty of fluids... Marland KitchenMarland Kitchen  Atenolol 50 Mg Tabs (Atenolol) .... Take 1 tablet by mouth once a day... 6)   Vytorin 10-80 Mg Tabs (Ezetimibe-simvastatin) .... Take one tablet by mouth at bedtime 7)  Omeprazole 20 Mg Cpdr (Omeprazole) ....Marland Kitchen1 once daily 8)  Premarin 0.625 Mg Tabs (Estrogens conjugated) .... Take 1 tablet by mouth once a day 9)  Hydrocodone-acetaminophen 10-650 Mg Tabs (Hydrocodone-acetaminophen) .... Take 1 tablet every 6 hrs as needed pain, not to exceed 4 tabs per day. (no early refills). 10)  Norpramin 100 Mg Tabs (Desipramine hcl) ....Marland Kitchen1 by mouth at bedtime 11)  Alprazolam 0.5 Mg Tabs (Alprazolam) .... Take 1/2 to 1 tab by mouth three times a day, not to exceed 3 pills per day. 12)  Ambien 10 Mg Tabs (Zolpidem tartrate) .... Take 1/2 to 1 tab by mouth at bedtime as needed for sleep 13)  Retin-a 0.025 % Crea (Tretinoin) .... Apply as directed 14)  Proair Hfa 108 (90 Base) Mcg/act Aers (Albuterol sulfate) .... Inhale 2 puffs every 4 hours as needed 15)  Augmentin 875-125 Mg Tabs (Amoxicillin-pot clavulanate) .Marland Kitchen.. 1 by mouth two times a day 16)  Hydromet 5-1.5 Mg/38m Syrp (Hydrocodone-homatropine) ..Marland Kitchen. 1-2 tsp every 4-6 hr as needed cough  Other Orders: Nebulizer Tx ([phone]  Patient Instructions: 1)  Augmentin 8762mtwo times a day for 7 days 2)  Hydromet 1-2 tsp every 4-6 hr as needed cough/ may make you sleepy avoid hydocodone pills if you take this .  3)  Mucinex DM two times a day as needed cough/congesiton 4)  Increase rest and fluids.  5)  Please contact office for sooner follow up if symptoms do not improve or worsen  Prescriptions: ADVAIR DISKUS 250-50 MCG/DOSE  MISC (FLUTICASONE-SALMETEROL) take one puff two times a day  rinse mouth after each use  #1 x 11   Entered by:   JeParke PoissonNA/MA   Authorized by:   TaRexene EdisonP   Signed by:   JeParke PoissonNA/MA on 01/23/2010   Method used:   [facility]       [address]     [place]  [address]     Ph: [phone]     Fax:  [phone] RxID:   [id number] 5-1.5 MG/5ML SYRP (HYDROCODONE-HOMATROPINE) 1-2 tsp every 4-6 hr as needed cough  #8 oz x 0   Entered and Authorized by:   TaRexene EdisonP   Signed by:   Tammy Parrett NP on 01/23/2010   Method used:   Print then Give to Patient   RxID:   [id number] 875-125 MG TABS (AMOXICILLIN-POT CLAVULANATE) 1 by mouth two times a day  #14 x 0   Entered and Authorized by:   TaRexene EdisonP   Signed by:   TaRexene EdisonP on 01/23/2010   Method used[facility]   [facility]       [address]     [place]  [address]     Ph: [phone]     Fax: [phone] RxID:   [id number]  Immunization History:  Influenza Immunization History:    Influenza:  historical (03/09/2007)     Medication Administration  Injection # 1:    Medication: Testosterone Cypionat 20051mng  Medication # 1:    Medication: Xopenex 1.49m66m Diagnosis: CHRONIC OBSTRUCTIVE ASTHMA UNSPECIFIED (ICD-493.20)    Dose: 1 vial     Route: inhaled    Exp Date: 08/12    Lot #: [id number]: sepracor    Patient tolerated medication without complications    Given by: JessIran Planas (January 23, 2010 11:41 AM)  Orders Added: 1)  Nebulizer Tx [[id number]] Est. Patient Level IV [992[32992]edication  Administration  Injection # 1:    Medication: Testosterone Cypionat 259m ing  Medication # 1:    Medication: Xopenex 1.21m   Diagnosis: CHRONIC OBSTRUCTIVE ASTHMA UNSPECIFIED (ICD-493.20)    Dose: 1 vial     Route: inhaled    Exp Date: 08/12    Lot #: [id number]  Mfr: sepracor    Patient tolerated medication without complications    Given by: JeIran PlanasMA (January 23, 2010 11:41 AM)  Orders Added: 1)  Nebulizer Tx [[id number][09295])  Est. Patient Level IV [9[74734]

## 2010-04-07 NOTE — Assessment & Plan Note (Signed)
Summary: pulm HTN, COPD, hypoxemia   Visit Type:  Follow-up Primary Provider/Referring Provider:  Dr. Teressa Lower  CC:  6 wk followup on Pulmonary HTN.  Pt states that her breathing has improved since last seen.  She states that she had been taking adderal before and since she has d/c'ed this med she feels much better.  She states that she stopped taking med " a long time ago".  Pt states that she never uses her o2 anymore because she feels that she does not need.  Marland Kitchen  History of Present Illness: 60 y/o WF, positive tobacco and COPD, anxiety d/o, chronic pain,  history of HTN, and hyperlipidemia, and anxiety, chronic reflux. Seen in past by Dr Doreatha Lew 2002 and found to have PASP  ~ 45-50 mmHg on TTE. Repeat TTE with estimated PASP 70 mmHg. She does report exposure to dexfenfluromine for about 3 months (was not on fen-phen) in early 2000's (she can't remember when). ANA negative.   ROS - has been very active, no real exertional SOB. She does c/o esophageal spasms, GERD symptoms. Still smokes and takes Advair, feels that it helps her. Denies any CP, no syncope of pre-syncope.   ROV 05/27/09 -- PFT's 04/24/09 showed severe obstruction + probable restriction. She never wore her supplimental O2 because she felt better.  She cancelled her R heart cath. Continues to take Advair. She has cut her cigarettes down to 1 -2 cigarettes a day. We rechecked autoimmune labs - ANA, aScl70, a-ds-DNA negative. RF borderline positive. She mentions today that she had formerly been adderal, stopped it many mo ago, wonders if this had anything to do with her breathing problems. She feels that her exertional tolerance is good, not sure she needs O2 or a R heart cath.   Current Medications (verified): 1)  Advair Diskus 250-50 Mcg/dose  Misc (Fluticasone-Salmeterol) .... Take One Puff Two Times A Day  Rinse Mouth After Each Use 2)  Proair Hfa 108 (90 Base) Mcg/act  Aers (Albuterol Sulfate) .... Inhale 2 Puffs Every 4 Hours As  Needed 3)  Atenolol 50 Mg  Tabs (Atenolol) .... Take 1 Tablet By Mouth Once A Day... 4)  Vytorin 10-80 Mg Tabs (Ezetimibe-Simvastatin) .... Take One Tablet By Mouth At Bedtime 5)  Omeprazole 20 Mg Cpdr (Omeprazole) .Marland Kitchen.. 1 Once Daily 6)  Premarin 0.625 Mg  Tabs (Estrogens Conjugated) .... Take 1 Tablet By Mouth Once A Day 7)  Hydrocodone-Acetaminophen 10-650 Mg  Tabs (Hydrocodone-Acetaminophen) .... Take 1 Tablet Every 6 Hrs As Needed Pain, Not To Exceed 4 Tabs Per Day. (No Early Refills). 8)  Norpramin 25 Mg  Tabs (Desipramine Hcl) .... Take Three Tabs At Bedtime 9)  Alprazolam 0.5 Mg  Tabs (Alprazolam) .... Take 1/2 To 1 Tab By Mouth Three Times A Day, Not To Exceed 3 Pills Per Day. 10)  Ambien 10 Mg  Tabs (Zolpidem Tartrate) .... Take 1/2 To 1 Tab By Mouth At Bedtime As Needed For Sleep 11)  Nitrostat 0.4 Mg Subl (Nitroglycerin) .... Place One Tablet Under Your Tongue As Needed  Allergies (verified): No Known Drug Allergies  Vital Signs:  Patient profile:   60 year old female Weight:      139.38 pounds O2 Sat:      97 % on Room air Temp:     97.9 degrees F oral Pulse rate:   72 / minute BP sitting:   142 / 90  (left arm)  Vitals Entered By: [place] Dome (May 27, 2009 3:40 PM)  O2 Flow:  Room air  Physical Exam  Additional Exam:  WD, WN, 60 y/o WF in NAD... GENERAL:  Alert & oriented; pleasant & cooperative, a bit tangential and distracted HEENT:  [place]/AT, EACs-clear, TMs-wnl, NOSE-clear, THROAT-clear & wnl. NECK:  Supple w/ decrROM; no JVD; normal carotid impulses w/o bruits; no thyromegaly or nodules palpated; no lymphadenopathy. CHEST:  Bilateral end exp whezes, no crackles HEART:  Regular Rhythm; without murmurs/ rubs/ or gallops. ABDOMEN:  Soft & nontender; normal bowel sounds; no organomegaly or masses detected. EXT: without deformities, mild arthritic changes; no varicose veins/ +venous insuffic/ no edema. Mild cyanosis fingers, worse on toes NEURO:  CN's intact;  no  focal neuro deficits noted.  DERM:  No lesions noted; no rash      Impression & Recommendations:  Problem # 1:  HYPERTENSION, PULMONARY (ICD-416.8)  She didn't wear O2, cancelled her R heart cath. I/m not sure whether she is going to proceed with the w/u. Her autoimmune labs showed an elevated RF, otherwise negative.  - repeat walking oximetry today and try to convince her that she needs to wear her O2 with exertion - I think we need to concentrate on the above, as well as compliance with her Advair. Could also try Spiriva to maximize her therapy for COPD, but I will leave this to Dr Lenna Gilford.  - She doesn't want a R heart cath. I could probably talk her into this, but I'm not sure that she is going to be appropriate for medical therapy for pulm HTN - she starts and stops medications without consulting w Dr Lenna Gilford. Compliance with meds, lab draws, testing to satisfy the payors, are all key when prescribing these meds.   - We can reconsider the R heart cath and meds for pulm HTN once we confirm that she can be compliant with O2 - I'll ask Dr Lenna Gilford to send her back to me if w believe that pulm HTN meds would be appropriate.   Orders: Est. Patient Level IV (62376)  Problem # 2:  CHRONIC OBSTRUCTIVE ASTHMA UNSPECIFIED (ICD-493.20) By PFT's.  - ? consider adding Spiriva to her Advair.   Medications Added to Medication List This Visit: 1)  Omeprazole 20 Mg Cpdr (Omeprazole) .Marland Kitchen.. 1 once daily  Patient Instructions: 1)  We confirmed today that you need to wear oxygen with ALL exertion.  2)  Keep your follow up with Dr Lenna Gilford. 3)  Continue your Advair two times a day  4)  Depending on how well you are able to tolerate the oxygen, we may decide to revisit performing a R heart cath.   Appended Document: pulm HTN, COPD, hypoxemia Ambulatory Pulse Oximetry  Resting; HR__75___    02 Sat__93%ra___  Lap1 (185 feet)   HR__115___   02 Sat__81%ra___ Lap2 (185 feet)   HR__179___   02 Sat__78%ra___      Lap3 (185 feet)   HR_____   02 Sat_____  ___Test Completed without Difficulty _x__Test Stopped due EG:[id number] o2 sats

## 2010-04-07 NOTE — Progress Notes (Signed)
Summary: talk to nurse  Phone Note Call from Patient Call back at [phone]   Caller: Patient Call For: nadel Reason for Call: Talk to Nurse Summary of Call: Wants to speak to nurse in ref to papers they were checking on for her. Initial call taken by: Netta Neat,  October 14, 2009 11:51 AM  Follow-up for Phone Call        Pt states she spoke to [facility] during a previous OV visit about having some records pulled from her paperchart in ref to a study or test that was done by the drug company who makes [employer]. Pt requesting a copy of same. Please advise. Thanks. Iran Planas CMA  October 14, 2009 12:31 PM   Additional Follow-up for Phone Call Additional follow up Details #1::        pt will need to go to medical records for this info.  thanks [place]  October 14, 2009 12:33 PM     Additional Follow-up for Phone Call Additional follow up Details #2::    called and spoke with pt. pt aware to call medical records regarding this. gave her medical records #.  nothing further needed.  Jinny Blossom Reynolds LPN  October 14, 2021 12:55 PM

## 2010-04-07 NOTE — Progress Notes (Signed)
Summary: rx  Phone Note Call from Patient Call back at Home Phone [phone]   Caller: Patient Call For: nadel Reason for Call: Talk to Nurse Summary of Call: pt is on generic prilosec, she keeps having esophagil spasms.  Would like [place] to call in Nitroglicerin tablets.  This is the best thing to stop them.  Can't afford prilosec, too exspensive. [place] Initial call taken by: Zigmund Gottron,  May 02, 2009 2:19 PM  Follow-up for Phone Call        called, spoke with pt.  Pt states she was on Prilosec for esophageal spams but weaned herself off a long time ago due to cost.  states she is currently taking omeprazole 13m once daily for this but it's not working-states she is having a few of these spams and is requesting nitroglycerin SL.  states SN has given her this in the past for these spasms and it worked.  Will forward to SN-please advise if this is ok.  Thanks!  WTana CoastNKDA Follow-up by: CRaymondo BandRN,  May 02, 2009 2:45 PM    New/Updated Medications: NITROSTAT 0.4 MG SUBL (NITROGLYCERIN) place one tablet under your tongue as needed Prescriptions: NITROSTAT 0.4 MG SUBL (NITROGLYCERIN) place one tablet under your tongue as needed  #25 x 2   Entered by:   LElita BooneCMA   Authorized by:   SNoralee SpaceMD   Signed by:   LElita BooneCMA on 05/02/2009   Method used:   [facility]       [address]      [place]  [address]      Ph: [phone]      Fax: [phone]  RxID:   [id number]

## 2010-04-07 NOTE — Letter (Signed)
Summary: Cardiac Catheterization Instructions- [place], [place]  [address] [place]   [place], [place] [address]   Phone: [phone]  Fax: [phone]     04/24/2009 MRN: [id number]  [place], [place]  [address]  Dear Ms. HAWKINS,   You are scheduled for a Cardiac Catheterization on Thursday 2/24 with Dr. Haroldine Laws  Please arrive to the 1st floor of the Heart and Vascular Center at [facility] at 9:30 am / pm on the day of your procedure. Please do not arrive before 6:30 a.m. Call the Heart and Vascular Center at [phone] if you are unable to make your appointmnet. The Code to get into the parking garage under the building is 0900. Take the elevators to the 1st floor. You must have someone to drive you home. Someone must be with you for the first 24 hours after you arrive home. Please wear clothes that are easy to get on and off and wear slip-on shoes. Do not eat or drink after midnight except water with your medications that morning. Bring all your medications and current insurance cards with you.  ___ DO NOT take these medications before your procedure: ________________________________________________________________  ___ Make sure you take your aspirin.  _X_ You may take ALL of your medications with water that morning. ________________________________________________________________________________________________________________________________  ___ DO NOT take ANY medications before your procedure.  ___ Pre-med instructions:  ________________________________________________________________________________________________________________________________  The usual length of stay after your procedure is 2 to 3 hours. This can vary.  If you have any questions, please call the office at the number listed above.   Kevan Rosebush, RN

## 2010-04-07 NOTE — Letter (Signed)
Summary: Cardiac Catheterization Instructions- [place], [place]  [address] [place]   [place], [place] [address]   Phone: [phone]  Fax: [phone]     09/11/2009 MRN: [id number]  [place], [place]  [address]  Dear [name],   You are scheduled for a Cardiac Catheterization on Friday 10/03/09 with Dr. Haroldine Laws   Please arrive to the 1st floor of the Heart and Vascular Center at [facility] at 8:30 am / pm on the day of your procedure. Please do not arrive before 6:30 a.m. Call the Heart and Vascular Center at [phone] if you are unable to make your appointmnet. The Code to get into the parking garage under the building is 1000. Take the elevators to the 1st floor. You must have someone to drive you home. Someone must be with you for the first 24 hours after you arrive home. Please wear clothes that are easy to get on and off and wear slip-on shoes. Do not eat or drink after midnight except water with your medications that morning. Bring all your medications and current insurance cards with you.  ___ DO NOT take these medications before your procedure: ________________________________________________________________  ___ Make sure you take your aspirin.  _X__ You may take ALL of your medications with water that morning. ________________________________________________________________________________________________________________________________  ___ DO NOT take ANY medications before your procedure.  ___ Pre-med instructions:  ________________________________________________________________________________________________________________________________  The usual length of stay after your procedure is 2 to 3 hours. This can vary.  If you have any questions, please call the office at the number listed above.   Kevan Rosebush, RN

## 2010-04-07 NOTE — Progress Notes (Signed)
Summary: poss yeast infection > diflucan 120m - LMTCB x1  Phone Note Call from Patient Call back at Home Phone ([phone]  Caller: Patient Call For: [name] [name] Summary of Call: Pt c/o itching, pain around the gential area, slight pain when urinating, suspects she may have a yeast infection pls advise.//[facility] [place] Initial call taken by: JNetta Neat  February 06, 2010 9:45 AM  Follow-up for Phone Call        Pt c/o vaginal itching, extreme pain worse at night, burning with urination, states has tried OTC yeast infection creams. Pt is requesting a "pill" for this problem. [facility] [place]. Please advise. Thanks. JIran PlanasCMA  February 06, 2010 11:14 AM   Additional Follow-up for Phone Call Additional follow up Details #1::        per SN: okay for diflucan 1070m  #8, 2 by mouth now then 1 daily till gone.  rx sent to [facility] [place].  LMOM TCB to inform pt of this. JeParke PoissonNA/MA  February 06, 2010 3:28 PM   pt aware rx sent. JeRandolph BingMA  February 06, 2010 3:46 PM     New/Updated Medications: FLUCONAZOLE 100 MG TABS (FLUCONAZOLE) 2 tabs by mouth now, then 1 daily till gone Prescriptions: FLUCONAZOLE 100 MG TABS (FLUCONAZOLE) 2 tabs by mouth now, then 1 daily till gone  #8 x 0   Entered by:   JeParke PoissonNA/MA   Authorized by:   ScNoralee SpaceD   Signed by:   JeParke PoissonNA/MA on 02/06/2010   Method used:   [facility] to        [facility]       [address]     [place]  [address]     Ph: [phone]     Fax: [phone] RxID:   [id number]

## 2010-04-07 NOTE — Progress Notes (Signed)
Summary: FORM FOR OXYGEN ON FLIGHT  Phone Note Call from Patient   Caller: Daughter-[phone] Call For: BYRUM Summary of Call: PT NEEDS A FORM FILLED OUT THAT SHES ON OXYGEN THEY HAVE THE FORM NEED TO KNOW IF WE WILL FILL OUT FOR A FLIGHT SHES TAKEN ON FRIDAY AM Initial call taken by: Don Broach,  December 23, 2009 4:11 PM  Follow-up for Phone Call        called and spoke with pt's daughter, Maudie Mercury.  Maudie Mercury states pt will be going to [facility] on Friday and will be going on an airplane ride.  She states she needs a form filled out by RB ok'ing pt to take her oxygen on airplane.  She is faxing form to triage fax #.  Daughter requests this form be filled out and then faxed back directly to [employer] and also would like a copy of form and will pick up.  waiting on form.  Matthew Folks LPN  December 23, 5699 5:24 PM   stil haven't received faxed yet.  Called and spoke with Maudie Mercury.  Maudie Mercury stated she had problems printing off form yesterday afternoon but states she will fax it this morning.  will call kim back once i receive fax to inform her that we got it.  Jinny Blossom Reynolds LPN  December 25, 7791 10:41 AM   Additional Follow-up for Phone Call Additional follow up Details #1::        received fax and put in RB's look at folder for him to review.  Called and spoke with Maudie Mercury who is aware I did receive the fax.  Jinny Blossom Reynolds LPN  December 24, 9028 11:23 AM     Additional Follow-up for Phone Call Additional follow up Details #2::    Form done. Thanks Collene Gobble MD  December 24, 2009 4:15 PM   [facility] for Maudie Mercury TCB to inform her paperwork faxed to [facility] and I also left a copy of the paperwork for her in an envelop at the front desk for her to pick up.  Matthew Folks LPN  December 24, 921 4:27 PM   Maudie Mercury returned call.  She was informed of above and verbalized understanding.   Raymondo Band RN  December 24, 2009 5:01 PM

## 2010-04-07 NOTE — Miscellaneous (Signed)
Summary: Orders UpdatePFT CHARGES   Clinical Lists Changes  Orders: Added new Service order of Carbon Monoxide diffusing w/capacity [phone]) - Signed Added new Service order of Lung Volumes (79909) - Signed Added new Service order of Spirometry (Pre & Post) [phone]) - Signed

## 2010-04-07 NOTE — Progress Notes (Signed)
Summary: out of meds- needs asap  Phone Note Call from Patient   Caller: Patient Call For: nadel Summary of Call: pt states she is completely out of her antidepressants. needs enough to last thru middle of next week (until she can get this from [place] (pt says she takes 1-2 at night) and DESIPRAMINE HCL (pt says she take 4 at night). [facility] [address]. pt says dr Lenna Gilford has approved these for her before. call her cell asap [phone] Initial call taken by: Cooper Render, CNA,  October 24, 2009 5:07 PM  Follow-up for Phone Call        Spoke with SN-ok to give Ambien 43m #7 take 1 by mouth at bedtime as needed no refills and Norpramin 1072m#7 take 1 by mouth once daily no refills.[facility] JeParke Poissonnd I called and left message for pt that a short term RX was called for each med.KaClayborne DanaMA  October 24, 2009 5:21 PM    Per SN-would like usKoreao call [facility] and see when the last time RX's were filled for patient and when the next time is due.KaBlair Hailey          Additional Follow-up for Phone Call Additional follow up Details #1::        called and spoke with [facility] about pts meds---they stated that the ambin was last filled in feb and needs a new rx for further refills and the norpramin last filled on 3-18 and will need rx for any further refills.   rx printed out for these meds.    Additional Follow-up for Phone Call Additional follow up Details #2::    [facility] norpramin. LeElita BooneMNantucket Cottage HospitalAugust 22, 2011 3:46 PM   Prescriptions: AMBIEN 10 MG  TABS (ZOLPIDEM TARTRATE) take 1/2 to 1 tab by mouth at bedtime as needed for sleep  #90 x 3   Entered by:   LeElita BooneMA   Authorized by:   ScNoralee SpaceD   Signed by:   LeElita BooneMA on 10/27/2009   Method used:   Printed then mailed to ...       [facility]       [address]     [place]  [address]     Ph: [phone]     Fax: [phone] RxID:   [id number] 100 MG TABS (DESIPRAMINE HCL) 1 by mouth at bedtime  #90 x 3   Entered by:   LeElita BooneMA   Authorized by:   ScNoralee SpaceD   Signed by:   LeElita BooneMA on 10/27/2009   Method used:   Printed then mailed to ...       [facility]       [address]     [place]  [address]     Ph: [phone]     Fax: [phone] RxID:   [id number] 10 MG  TABS (ZOLPIDEM TARTRATE) take 1/2 to 1 tab by mouth at bedtime as needed for sleep  #7 x 0   Entered by:   KaClayborne DanaMA   Authorized  by:   Noralee Space MD   Signed by:   Clayborne Dana CMA on 10/24/2009   Method used:   Telephoned to ...       [facility]       [address]       [place], [place]  [address]       Ph: [phone]       Fax: [phone]   RxID:   [id number] NORPRAMIN 100 MG TABS (DESIPRAMINE HCL) 1 by mouth at bedtime  #7 x 0   Entered by:   Clayborne Dana CMA   Authorized by:   Noralee Space MD   Signed by:   Clayborne Dana CMA on 10/24/2009   Method used:   Telephoned to ...       [facility]       [address]       [place], [place]  [address]       Ph: [phone]       Fax: [phone]   RxID:   [id number]

## 2010-04-07 NOTE — Assessment & Plan Note (Signed)
Summary: PAH, COPD   Visit Type:  Follow-up Primary Provider/Referring Provider:  Dr. Teressa Lower  CC:  Pulmonary hypertension consult. The patient denies any problems with her breathing.Marland Kitchen  History of Present Illness: 60 y/o WF, positive tobacco and COPD, anxiety d/o, chronic pain,  history of HTN, and hyperlipidemia, and anxiety, chronic reflux. Seen in past by Dr Doreatha Lew 2002 and found to have PASP  ~ 45-50 mmHg on TTE. Repeat TTE with estimated PASP 70 mmHg. She does report exposure to dexfenfluromine for about 3 months (was not on fen-phen) in early 2000's (she can't remember when). ANA negative.   ROS - has been very active, no real exertional SOB. She does c/o esophageal spasms, GERD symptoms. Still smokes and takes Advair, feels that it helps her. Denies any CP, no syncope of pre-syncope.   Preventive Screening-Counseling & Management  Alcohol-Tobacco     Smoking Status: current     Packs/Day: 0.5     Year Started: 1980?  Current Medications (verified): 1)  Advair Diskus 250-50 Mcg/dose  Misc (Fluticasone-Salmeterol) .... Take One Puff Two Times A Day  Rinse Mouth After Each Use 2)  Proair Hfa 108 (90 Base) Mcg/act  Aers (Albuterol Sulfate) .... Inhale 2 Puffs Every 4 Hours As Needed 3)  Atenolol 50 Mg  Tabs (Atenolol) .... Take 1 Tablet By Mouth Once A Day... 4)  Vytorin 10-80 Mg Tabs (Ezetimibe-Simvastatin) .... Take One Tablet By Mouth At Bedtime 5)  Prilosec 20 Mg Cpdr (Omeprazole) .Marland Kitchen.. 1 By Mouth Once Daily 6)  Premarin 0.625 Mg  Tabs (Estrogens Conjugated) .... Take 1 Tablet By Mouth Once A Day 7)  Hydrocodone-Acetaminophen 10-650 Mg  Tabs (Hydrocodone-Acetaminophen) .... Take 1 Tablet Every 6 Hrs As Needed Pain, Not To Exceed 4 Tabs Per Day. (No Early Refills). 8)  Norpramin 25 Mg  Tabs (Desipramine Hcl) .... Take Three Tabs At Bedtime 9)  Alprazolam 0.5 Mg  Tabs (Alprazolam) .... Take 1/2 To 1 Tab By Mouth Three Times A Day, Not To Exceed 3 Pills Per Day. 10)  Ambien 10  Mg  Tabs (Zolpidem Tartrate) .... Take 1/2 To 1 Tab By Mouth At Bedtime As Needed For Sleep  Allergies (verified): No Known Drug Allergies  Past History:  Past Medical History: Reviewed history from 12/23/2008 and no changes required. CHRONIC OBSTRUCTIVE ASTHMA UNSPECIFIED (ICD-493.20) HYPERTENSION (ICD-401.9) HYPERCHOLESTEROLEMIA (ICD-272.0) GERD (ICD-530.81) IRRITABLE BOWEL SYNDROME (ICD-564.1) DEGENERATIVE JOINT DISEASE (ICD-715.90) DISC DISEASE, CERVICAL (ICD-722.4) FIBROMYALGIA (ICD-729.1) VITAMIN D DEFICIENCY (ICD-268.9) CHRONIC PAIN SYNDROME (ICD-338.4) HEADACHE, CHRONIC (ICD-784.0) ANXIETY DISORDER (ICD-300.00)  Past Surgical History: Reviewed history from 12/23/2008 and no changes required. S/P hysterectomy in 2001 S/P ant cervical discectomy & fusion by DrYates in 2002 S/P bilat carpal tunnel releases by DrWeingold  Family History: Reviewed history and no changes required. Family History MI/Heart Attack---mother Bone cancer---father Seasonal allergies---father  Social History: Reviewed history and no changes required. Patient is a current smoker. Smokes 5 cig a day. About 10-15 pk-yrs.  Disabled, formerly worked Data processing manager. No other significant exposures.  MarriedSmoking Status:  current Packs/Day:  0.5  Review of Systems       The patient complains of cough, wheezing, and cyanosis.  The patient denies dyspnea at rest, dyspnea with exertion, chest discomfort, pleuritic chest pain, abdominal pain, syncope, near syncope, palpitations, peripheral edema, hemoptysis, hoarseness, Raynaud's phenomenon, and fatigue.         Occasional episodes of cyanosis in fingers, no Raynauds, espec when it's cold.  Struggles with depression, GERD symptoms and esophageal spasms.  Vital Signs:  Patient profile:   60 year old female Height:      63 inches (160.02 cm) Weight:      142.13 pounds (64.60 kg) BMI:     25.27 O2 Sat:      92 % on Room air Temp:     97.6  degrees F (36.44 degrees C) oral Pulse rate:   69 / minute BP sitting:   118 / 78  (right arm) Cuff size:   regular  Vitals Entered By: Francesca Jewett CMA (April 17, 2009 9:34 AM)  O2 Sat at Rest %:  92 O2 Flow:  Room air  Serial Vital Signs/Assessments:  Comments: Ambulatory Pulse Oximetry  Resting; HR__67___    02 Sat__91___  Lap1 (185 feet)   HR__78___   02 Sat__90___ Lap2 (185 feet)   HR__84___   02 Sat__80___    Lap3 (185 feet)   HR_____   02 Sat_____  ___Test Completed without Difficulty __x_Test Stopped due to:  O2 sats dropped to 80% on room air after 2 laps.  The patient was placed on 2 liters of oxygen and her sats recovered to 95%.  By: Francesca Jewett CMA   CC: Pulmonary hypertension consult. The patient denies any problems with her breathing.   Physical Exam  Additional Exam:  WD, WN, 60 y/o WF in NAD... GENERAL:  Alert & oriented; pleasant & cooperative, a bit tangential and distracted HEENT:  [place]/AT, EACs-clear, TMs-wnl, NOSE-clear, THROAT-clear & wnl. NECK:  Supple w/ decrROM; no JVD; normal carotid impulses w/o bruits; no thyromegaly or nodules palpated; no lymphadenopathy. CHEST:  Bilateral end exp whezes, no crackles HEART:  Regular Rhythm; without murmurs/ rubs/ or gallops. ABDOMEN:  Soft & nontender; normal bowel sounds; no organomegaly or masses detected. EXT: without deformities, mild arthritic changes; no varicose veins/ +venous insuffic/ no edema. Mild cyanosis fingers NEURO:  CN's intact;  no focal neuro deficits noted.  DERM:  No lesions noted; no rash      Impression & Recommendations:  Problem # 1:  HYPERTENSION, PULMONARY (ICD-416.8)  Pulm HTN noted on TTE 10/10 in asymptomatic patient. She does have COPD, severity not clear. Also with hx Redux exposure. ANA negative 1/11.  - walking oximetry confirms hypoxemia, will discuss orderiing O2 - full PFT's  - RF, scl70, anti-ds DNA - need to discuss a R heart cath. She has seen Dr Doreatha Lew  before.   Orders: Consultation Level IV ([address]) TLB-Rheumatoid Factor (RA) (86578-IO) T- * Misc. Laboratory test [phone]) Pulmonary Referral (Pulmonary) Cardiology Referral (Cardiology) DME Referral (DME)  Patient Instructions: 1)  We will set you up with oxygen while you are walking 2)  We will arrange for a R heart catherization 3)  We will perform full PFT's 4)  We will check labwork today 5)  Continue your Advair as you are taking it.  6)  Follow up with Dr Lamonte Sakai in 6 weeks

## 2010-04-07 NOTE — Progress Notes (Signed)
Summary: sch cath   Phone Note Outgoing Call   Summary of Call: Received mess from Pulm that pt needed to have right heart cath, spoke w/pt she is available to have cath on Thur 2/24, cath sch for 2/24 at 10:30, call pt and Left message to call back for details and time Kevan Rosebush, RN  April 25, 2009 5:08 PM   Follow-up for Phone Call        Pt calling and wants to speak to nurse about cath Barry  April 28, 2009 12:47 PM  had to cancel cath on 2/24 due to Dr Bensimhon's sch, have called pt and left mess to call back Kevan Rosebush, RN  April 29, 2009 11:08 AM   Left message to call back Kevan Rosebush, RN  May 01, 2009 4:07 PM   Additional Follow-up for Phone Call Additional follow up Details #1::        Left message to call back Kevan Rosebush, RN  May 02, 2009 4:00 PM   spoke w/pt she is holding off on cath right now she will see Dr Lamonte Sakai back on 3/22 to discuss Kevan Rosebush, RN  May 06, 2009 3:16 PM

## 2010-04-07 NOTE — Progress Notes (Signed)
Summary: talk to nurse-  Phone Note Call from Patient   Caller: Patient Call For: nadel Summary of Call: pt want to talk to tammy d not about medical problem Initial call taken by: Gustavus Bryant,  April 18, 2009 4:18 PM  Follow-up for Phone Call        Spoke with pt.  She states that she was overwhelmed at [facility] yesterday.  She states that she "did'nt listen" at ov yesterday to what RB was telling her.  She states, "when am I going to die"? I advised that this is an inappropriate question for me to answer.  Pt understood and states that she wants to know so that she can "get affairs in order".  Pt very anxious about being on o2.  She would like for RB to call her sometime to discuss her prognosis.  She is aware that he is out of the office until 04/21/09. Follow-up by: Tilden Dome,  April 18, 2009 4:36 PM  Additional Follow-up for Phone Call Additional follow up Details #1::        Please call pt and tell her that her workup is still ongoing and that I will be glad to talk to her about prognosis after we sort it out, at our [place].  Additional Follow-up by: Collene Gobble MD,  April 21, 2009 10:18 AM    Additional Follow-up for Phone Call Additional follow up Details #2::    lmomtcb Tilden Dome  April 21, 2009 10:31 AM  pt advised. [place] Bing CMA  April 22, 2009 9:05 AM

## 2010-04-07 NOTE — Progress Notes (Signed)
Summary: rx-LMTCB x2  Phone Note Call from Patient Call back at Home Phone [phone]   Caller: Patient Call For: nadel Reason for Call: Refill Medication, Talk to Nurse Summary of Call: need 90 day supply of Alprazolam & Advair sent to [facility].  Can you fax this in? Initial call taken by: Zigmund Gottron,  March 31, 2009 2:26 PM  Follow-up for Phone Call        Is it okay to send the alprazolam? Last ov note under anxiety, SN wrote eval & rx per psych.  Please advise thanks! Follow-up by: Tilden Dome,  March 31, 2009 2:57 PM  Additional Follow-up for Phone Call Additional follow up Details #1::        per SN----she gets the pain meds from SN---up to 4 daily---and the other meds ambien, xanax and norpramin from Dr. Erling Cruz in [place]  this is her psych. she will need to call Dr. Erling Cruz for refills of these meds.   Elita Boone [facility]  March 31, 2009 4:11 PM      Additional Follow-up for Phone Call Additional follow up Details #2::    LMOMTCB Tilden Dome  March 31, 2009 4:18 PM [facility].  Jinny Blossom Reynolds LPN  April 01, 3844 9:51 AM  Please call pt back.  She returned call.  Follow-up by: Zigmund Gottron,  April 01, 2009 10:16 AM  Additional Follow-up for Phone Call Additional follow up Details #3:: Details for Additional Follow-up Action Taken: Spoke with pt and made aware of the above recs per SN.  She states that she meant to ask for refills on advair and omeprazole.  I sent rx for omeprazole.   Nothing further needed. Additional Follow-up by: Tilden Dome,  April 01, 2009 5:20 PM  Prescriptions: PRILOSEC 20 MG CPDR (OMEPRAZOLE) 1 by mouth once daily Brand medically necessary #90 x 3   Entered by:   Tilden Dome   Authorized by:   Noralee Space MD   Signed by:   Tilden Dome on 04/01/2009   Method used:   Electronically to        [facility] (mail-order)             ,          Ph: [phone]       Fax: [phone]   RxID:    [id number] ADVAIR DISKUS 250-50 MCG/DOSE  MISC (FLUTICASONE-SALMETEROL) take one puff two times a day  rinse mouth after each use  #3 x 3   Entered by:   Tilden Dome   Authorized by:   Noralee Space MD   Signed by:   Tilden Dome on 03/31/2009   Method used:   Faxed to ...       [facility] (mail-order)             ,          Ph: [phone]       Fax: [phone]   RxID:   [id number]

## 2010-04-07 NOTE — Assessment & Plan Note (Signed)
Summary: [name] [name]   Visit Type:  Follow-up Primary Provider/Referring Provider:  Dr. Teressa Lower  CC:  PAH. The patient says she is doing well. She says she wears her oxygen every night and when needed during the day. Currently on augmentin for cough..  History of Present Illness: 60 y/o WF, positive tobacco and COPD, anxiety d/o, chronic pain,  history of HTN, and hyperlipidemia, and anxiety, chronic reflux. Seen in past by Dr Doreatha Lew 2002 and found to have PASP  ~ 45-50 mmHg on TTE. Repeat TTE with estimated PASP 70 mmHg. She does report exposure to dexfenfluromine for about 3 months (was not on fen-phen) in early 2000's (she can't remember when). ANA negative.   ROS - has been very active, no real exertional SOB. She does c/o esophageal spasms, GERD symptoms. Still smokes and takes Advair, feels that it helps her. Denies any CP, no syncope of pre-syncope.   ROV 05/27/09 -- PFT's 04/24/09 showed severe obstruction + probable restriction. She never wore her supplimental O2 because she felt better.  She cancelled her R heart cath. Continues to take Advair. She has cut her cigarettes down to 1 -2 cigarettes a day. We rechecked autoimmune labs - ANA, aScl70, a-ds-DNA negative. RF borderline positive. She mentions today that she had formerly been adderal, stopped it many mo ago, wonders if this had anything to do with her breathing problems. She feels that her exertional tolerance is good, not sure she needs O2 or a R heart cath.    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 Since last visit, she has decided to undergo R heart cath (7/29) showing mean PAP 41, PAOP 10. She quit smoking in May 2011! Tells me that she is using O2 with some exertion but not all. She doesn't feel very limited by her breathing. She is able to do most activities, but has curtailed some things - does chores but slowly, some SOB with climbing several flights of stairs.   Current Medications  (verified): 1)  Oxygen .... At Bedtime 2)  Advair Diskus 250-50 Mcg/dose  Misc (Fluticasone-Salmeterol) .... Take One Puff Two Times A Day  Rinse Mouth After Each Use 3)  Spiriva Handihaler 18 Mcg Caps (Tiotropium Bromide Monohydrate) .... Inhale Contents of One Capsule Daily Via Handihaler. 4)  Proair Hfa 108 (90 Base) Mcg/act  Aers (Albuterol Sulfate) .... Inhale 2 Puffs Every 4 Hours As Needed 5)  Mucinex Maximum Strength 1200 Mg Xr12h-Tab (Guaifenesin) .... Take 1 Tab By Mouth Two Times A Day W/ Plenty of Fluids.Marland KitchenMarland Kitchen 6)  Atenolol 50 Mg  Tabs (Atenolol) .... Take 1 Tablet By Mouth Once A Day... 7)  Vytorin 10-80 Mg Tabs (Ezetimibe-Simvastatin) .... Take One Tablet By Mouth At Bedtime 8)  Omeprazole 20 Mg Cpdr (Omeprazole) .Marland Kitchen.. 1 Once Daily 9)  Premarin 0.625 Mg  Tabs (Estrogens Conjugated) .... Take 1 Tablet By Mouth Once A Day 10)  Hydrocodone-Acetaminophen 10-650 Mg  Tabs (Hydrocodone-Acetaminophen) .... Take 1 Tablet Every 6 Hrs As Needed Pain, Not To Exceed 4 Tabs Per Day. (No Early Refills). 11)  Norpramin 25 Mg  Tabs (Desipramine Hcl) .... Take Three Tabs At Bedtime 12)  Alprazolam 0.5 Mg  Tabs (Alprazolam) .... Take 1/2 To 1 Tab By Mouth Three Times A Day, Not To Exceed 3 Pills Per Day. 13)  Ambien 10 Mg  Tabs (Zolpidem Tartrate) .... Take 1/2 To 1 Tab By Mouth At Bedtime As Needed For Sleep 14)  Augmentin 875-125 Mg  Tabs (Amoxicillin-Pot Clavulanate) .... Take 1 Tab By Mouth Two Times A Day Til Gone...  Allergies (verified): No Known Drug Allergies  Vital Signs:  Patient profile:   60 year old female Height:      63 inches (160.02 cm) Weight:      161 pounds (73.18 kg) BMI:     28.62 O2 Sat:      96 % on Room air Temp:     97.4 degrees F (36.33 degrees C) oral Pulse rate:   80 / minute BP sitting:   134 / 84  (left arm) Cuff size:   regular  Vitals Entered By: Francesca Jewett CMA (October 09, 2009 2:22 PM)  O2 Sat at Rest %:  96 O2 Flow:  Room air CC: PAH. The patient says she  is doing well. She says she wears her oxygen every night and when needed during the day. Currently on augmentin for cough. Comments Medications reviewed. Daytiime phone verified. Francesca Jewett CMA  October 09, 2009 2:23 PM   Physical Exam  Additional Exam:  WD, WN, 60 y/o WF in NAD... GENERAL:  Alert & oriented; pleasant & cooperative, a bit tangential and distracted HEENT:  [place]/AT, NOSE-clear, THROAT-clear & wnl. NECK:  no JVD; no lymphadenopathy. CHEST:  Distant but clear HEART:  Regular Rhythm; without murmurs/ rubs/ or gallops. ABDOMEN:  Not Examined EXT: without deformities, mild arthritic changes; no varicose veins/ +venous insuffic/ no edema. Mild cyanosis fingers, worse on toes NEURO:  CN's intact;  no focal neuro deficits noted.  DERM:  No lesions noted; no rash      Cardiac Cath  Procedure date:  10/03/2009  Findings:      FINDINGS:  Right atrial pressure mean of 9, RV pressure 58/4 with an EDP of 10, PA pressure 61/27 with a mean of 41.  Pulmonary capillary wedge pressure mean of 10.  Fick cardiac output 2.7 with a cardiac index of 1.6.  Thermodilution cardiac output 3.0 with a cardiac index of 1.7. Pulmonary vascular resistance was 10.3 [place] units.  Saturations; the pulmonary artery saturation was 51% and 56%.  The femoral artery saturation was 96%.  Of note, her oxygen saturations dropped into the mid 80s while she was dozing, but when she was awake and talking, the saturations were in the low to mid 90s by pulse oximetry.   ASSESSMENT: 1. Moderate pulmonary arterial hypertension with evidence of cor     pulmonale. 2. Severe chronic obstructive pulmonary disease by PFTs with related     oxygen desaturations.   PLAN/DISCUSSIONS:  I suspect [name] [name]'s pulmonary arterial hypertension is likely secondary to her severe obstructive lung disease. At this point, I would focus on optimizing her COPD regimen including ensuring adequate oxygenation at all  times.  I have discussed this with her particularly the need to be completely compliant with her oxygen therapy.  They may also be reasonable to change her atenolol to something like amlodipine.  Given her severe lung disease, I would be somewhat hesitant to consider pulmonary vasodilators despite her markedly elevated pulmonary vascular resistance given the risk of worsening shunt.  However, it was thought that she did have another component due to her pulmonary hypertension aside from her COPD, one could consider very cautious trial of an inhaled prostacyclin like Ventavis, but once again I would be very watchful to make sure she was into having a worsening shunt.  I have discussed this with Dr. Lamonte Sakai and I will follow up with  him in the clinic.  Impression & Recommendations:  Problem # 1:  HYPERTENSION, PULMONARY (ICD-416.8) Secondary to COPD, chronic hypoxemia, diet drug exposure. We discussed her R heart cath results, the potential benefits of starting PAH meds. Explained that she may or may not benefit from these meds, we would have to do the trial to see if she benefitted,  ~ 3 months. She understands, wants to wait for now, revisit in several months.  - for now concentrate on staying off cigs, wearing O2 reliably with ALL exertion.  - f/u in 4 months to revisit whether she wants to try meds, either PDE5 or inhaled therapy.

## 2010-04-07 NOTE — Progress Notes (Signed)
Summary: fyi  Phone Note Call from Patient Call back at Home Phone [phone]   Caller: Patient Call For: nadel Summary of Call: FYI: Dr Erling Cruz is the one who prescribes her xanax not Lenna Gilford. Initial call taken by: Netta Neat,  February 04, 2010 1:09 PM  Follow-up for Phone Call        Spoke with pt and she states at last [place] had asked her to see who prescribed her Lorrin Mais and xanax and pt states it is Dr. Erling Cruz, however I see where we have sent in refills for both in the past but I will forward as an FYI. Ariton Bing CMA  February 04, 2010 2:29 PM   Additional Follow-up for Phone Call Additional follow up Details #1::        meds have been updated---per SN request Elita Boone CMA  February 04, 2010 3:45 PM     New/Updated Medications: ALPRAZOLAM 0.5 MG  TABS (ALPRAZOLAM) take 1/2 to 1 tab by mouth three times a day, not to exceed 3 pills per day per Dr. Pricilla Riffle 10 MG  TABS (ZOLPIDEM TARTRATE) take 1/2 to 1 tab by mouth at bedtime as needed for sleep per Dr. Erling Cruz

## 2010-04-07 NOTE — Progress Notes (Signed)
Summary: appt  Phone Note Call from Patient Call back at Home Phone [phone] Call back at [phone]   Caller: Patient Call For: [name] [name] Summary of Call: Pt wants to know if she needs to rsc appt with Dr. Lenna Gilford, she stated she spoke with Purvis Kilts and was told she didn't hava an upcoming appt. pls advise. Initial call taken by: Netta Neat,  October 15, 2009 9:39 AM  Follow-up for Phone Call        ATC line busy. Iran Planas CMA  October 15, 2009 9:45 AM   ATC pt, no answer, no machine to leave a message. Iran Planas CMA  October 15, 2009 10:23 AM   ATC pt, no answer, no machine to leave message.  Raymondo Band RN  October 15, 2009 10:41 AM   Additional Follow-up for Phone Call Additional follow up Details #1::        Patient needs to know if she needs to see SN soon. Pt had rov with Dr. Lamonte Sakai on 10/09/2009 and says she was told by the schedulers that she did not have an upcoming appt with SN. Pt NOS appt with SN on 10/14/2009. Please advise if pt needs to be seen. Francesca Jewett [facility]  October 15, 2009 2:19 PM    Additional Follow-up for Phone Call Additional follow up Details #2::    -per SN---ok to just keep 4 month follow up with RB.  thanks Simpson  October 15, 2009 2:22 PM   Patient is aware okay to wait and see RB in 4 months per SN. She did say her Norpramine is now 141m daily. Med list has been updated to reflect this change. Follow-up by: LFrancesca JewettCMA,  October 15, 2009 2:49 PM  New/Updated Medications: NORPRAMIN 100 MG TABS (DESIPRAMINE HCL) 1 by mouth at bedtime

## 2010-04-07 NOTE — Letter (Signed)
Summary: CMN for Oxygen/[facility]  CMN for Oxygen/[facility]   Imported By: Phillis Knack 05/21/2009 13:38:42  _____________________________________________________________________  External Attachment:    Type:   Image     Comment:   External Document

## 2010-04-07 NOTE — Progress Notes (Signed)
Summary: retin-a cream refill > not prescribed by SN, another doc to fill  Phone Note From Pharmacy Call back at [phone]   Caller: Patient Caller: [name] [name] Call For: [name]  Summary of Call: [place] 0.025% PT'S ID# [id number] Initial call taken by: Gustavus Bryant,  November 12, 2009 4:07 PM  Follow-up for Phone Call        called spoke with pharmacist at [facility].  she states that Stieva Cream or Retin-A cream from 2006 from SN with more recent rx's written by another physician.  pharmacist states they will send the rx request to the physician who has filled this recently.  however, this has been added to pt's med list since we did not have it. Follow-up by: Parke Poisson CNA/MA,  November 12, 2009 4:48 PM    New/Updated Medications: RETIN-A 0.025 % CREA (TRETINOIN) apply as directed

## 2010-04-07 NOTE — Progress Notes (Signed)
Summary: rx  Phone Note Call from Patient Call back at Home Phone [phone]   Caller: Patient Call For: Armstrong Creasy Reason for Call: Talk to Nurse Summary of Call: pt took a zpac and has finished it but not any better.  She said this normally takes 2 rounds before she gets rid of it.  Wanted to know if SN would call another zpac in  for her before the week end. [place] Initial call taken by: Zigmund Gottron,  May 01, 2009 3:40 PM  Follow-up for Phone Call        pt thinks 1 more round will clear things up for her--pls advise if ok to call in   [place]  May 01, 2009 3:59 PM    per SN---ok for pt to have another round of abx---zpak  #1  as directed with no refills.  thanks.  called and spoke with pt and she is aware of this. Elita Boone [facility]  May 01, 2009 4:50 PM     Prescriptions: Azucena Fallen Z-PAK 250 MG TABS (AZITHROMYCIN) Take as directed  #1 pak x 0   Entered by:   Elita Boone CMA   Authorized by:   Noralee Space MD   Signed by:   Elita Boone CMA on 05/01/2009   Method used:   Electronically to        [facility] (retail)       [address]       [place], [place]  [address]       Ph: [phone]       Fax: [phone]   RxID:   [id number]

## 2010-04-07 NOTE — Progress Notes (Signed)
Summary: needs refill  Phone Note Call from Patient Call back at [phone]   Caller: Patient Call For: nadel Summary of Call: needs antibodic called in [place] [facility] [address] Initial call taken by: Don Broach,  October 03, 2009 12:28 PM  Follow-up for Phone Call        Spoke with pt.  Pt states she was supposed to take augmentin at last OV on September 01, 2009.  Looks like this was printed and given to pt but she says she did not receive it.  Pt states she is having prod cough wth green and brown mucus off and on since last ov and sinus pressure.  Occ wheezing.  Denies chest tightness, increased SOB, f/c/s.  States she had cardiac cath today -- requesting abx to be sent into [facility] on [address].  KNDA. TP-pls advise.  thanks! Follow-up by: Raymondo Band RN,  October 03, 2009 12:42 PM  Additional Follow-up for Phone Call Additional follow up Details #1::        aumgentin 838m two times a day #14 no refills Please contact office for sooner follow up if symptoms do not improve or worsen  Additional Follow-up by: TRexene EdisonNP,  October 03, 2009 12:48 PM    Additional Follow-up for Phone Call Additional follow up Details #2::    ATC number provided above.  LMOMTCB [facility]  CRaymondo BandRN  October 03, 2009 12:52 PM  Spoke with pt.  [facility] if sxs do not improve or get worse.  She verbalized understanding. CRaymondo BandRN  October 03, 2009 1:01 PM   Prescriptions: AUGMENTIN 875-125 MG TABS (AMOXICILLIN-POT CLAVULANATE) take 1 tab by mouth two times a day til gone...  #14 x 0   Entered by:   CRaymondo BandRN   Authorized by:   TRexene EdisonNP   Signed by:   CRaymondo BandRN on 10/03/2009   Method used:   [facility]       [address]      [place]  [id number]      Ph: [phone]      Fax: [phone]  RxID:   [id number]

## 2010-04-07 NOTE — Progress Notes (Signed)
Summary: talk to pcc regarding order  Phone Note From Other Clinic Call back at [phone]   Caller: [facility] ([facility] Summary of Call: see if you want excertion only or what did you need on this order.  Initial call taken by: Adin Hector,  April 17, 2009 12:07 PM  Follow-up for Phone Call        called millie_0  and informed her of ra sats_1  92% Follow-up by: Ova Freshwater,  April 17, 2009 12:41 PM
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
## 2010-04-07 NOTE — Progress Notes (Signed)
Summary: SINUS/ FEVER  Phone Note Call from Patient   Caller: Patient Call For: [name] Summary of Call: SINUS INFECTION WITH FEVER UNABLE TO COME IN TODAY BECAUSE SHE HAS NO TRANSPORTATION Initial call taken by: Gustavus Bryant,  April 28, 2009 9:36 AM  Follow-up for Phone Call        The patient c/o sinus congestion and chest congestion with green mucus for 3 days. She says she has had a low grade fever and sore throat also. She is requesting a Zpak. Please advise. Francesca Jewett [facility]  April 28, 2009 9:52 AM  Additional Follow-up for Phone Call Additional follow up Details #1::        per SN---ok for pt to have zpak  #1  take as directed with no refills.  thanks [place]  April 28, 2009 11:00 AM   Pt is aware of RX for Zpak to [facility] on [address] and will call if her sxs do not improve or get worse. Additional Follow-up by: Francesca Jewett CMA,  April 28, 2009 11:18 AM    New/Updated Medications: ZITHROMAX Z-PAK 250 MG TABS (AZITHROMYCIN) Take as directed Prescriptions: ZITHROMAX Z-PAK 250 MG TABS (AZITHROMYCIN) Take as directed  #1 x 0   Entered by:   Francesca Jewett CMA   Authorized by:   Noralee Space MD   Signed by:   Francesca Jewett CMA on 04/28/2009   Method used:   Electronically to        [facility] (retail)       [address]       [place], [place]  [address]       Ph: [phone]       Fax: [phone]   RxID:   [id number]

## 2010-04-07 NOTE — Progress Notes (Signed)
  Phone Note Other Incoming   Request: Send information Summary of Call: Request for records received from [facility]. Request forwarded to [employer].

## 2010-04-07 NOTE — Progress Notes (Signed)
Summary: re: [name] [name]  Phone Note Call from Patient Call back at [facility] Phone [phone]   Caller: Patient Call For: Ellyssa Zagal Summary of Call: pt was seen today. she wants to make nurse aware that the [facility] is prescribed by dr love.  Initial call taken by: Cooper Render, CNA,  January 23, 2010 12:04 PM  Follow-up for Phone Call        TP aware. [place] Bing CMA  January 23, 2010 12:11 PM

## 2010-04-07 NOTE — Miscellaneous (Signed)
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 Lung volumes show coexisting restriction. Decreased diffusion capacity that corects to normal range for alveolar volume. RSB (04/24/2009 10:40) Added new observation of DLCO/VA%EXP: 87 % (04/24/2009 10:40) Added new observation of DLCO/VA: 3.28 % (04/24/2009 10:40) Added new observation of DLCO % EXPEC: 48 % (04/24/2009 10:40) Added new observation of DLCO: 9.3 % (04/24/2009 10:40) Added new observation of RV % EXPECT: 78 % (04/24/2009 10:40) Added new observation of RV: 1.38 L (04/24/2009 10:40) Added new observation of TLC % EXPECT: 74 % (04/24/2009 10:40) Added new observation of TLC: 3.47 L (04/24/2009 10:40) Added new observation of FEF2575%EXPS: 15 % (04/24/2009 10:40) Added new observation of PSTFEF25/75P: 2.50  (04/24/2009 10:40) Added new observation of PSTFEF25/75%: 0.39 L/min (04/24/2009 10:40) Added new observation of FEV1FVCPRDPS: 73 % (04/24/2009 10:40) Added new observation of PSTFEV1/FVC: 48 % (04/24/2009 10:40) Added new observation of POSTFEV1%PRD: 49 % (04/24/2009 10:40) Added new observation of FEV1PRDPST: 2.15 L (04/24/2009 10:40) Added new observation of POST FEV1: 1.04 L/min (04/24/2009 10:40) Added new observation of POST FVC%EXP: 74 % (04/24/2009 10:40) Added new observation of FVCPRDPST: 2.93 L/min (04/24/2009 10:40) Added new observation of POST FVC: 2.18 L (04/24/2009 10:40) Added new observation of FEF % EXPEC: 14 % (04/24/2009 10:40) Added new observation of FEF25-75%PRE: 2.50 L/min (04/24/2009 10:40) Added new observation of FEF 25-75%: 0.36 L/min (04/24/2009 10:40) Added new observation of FEV1/FVC PRE: 73 % (04/24/2009 10:40) Added new observation of FEV1/FVC: 48 % (04/24/2009 10:40) Added new observation of FEV1 % EXP: 47 % (04/24/2009 10:40) Added new observation of FEV1 PREDICT: 2.15 L (04/24/2009 10:40) Added new observation  of FEV1: 1.00 L (04/24/2009 10:40) Added new observation of FVC % EXPECT: 71 % (04/24/2009 10:40) Added new observation of FVC PREDICT: 2.93 L (04/24/2009 10:40) Added new observation of FVC: 2.09 L (04/24/2009 10:40) Added new observation of PFT HEIGHT: 63  (04/24/2009 10:40) Added new observation of PFT DATE: 04/23/2009  (04/24/2009 10:40)  Pulmonary Function Test Date: 04/23/2009 Height (in.): 63 Gender: Female  Pre-Spirometry FVC    Value: 2.09 L/min   Pred: 2.93 L/min     % Pred: 71 % FEV1    Value: 1.00 L     Pred: 2.15 L     % Pred: 47 % FEV1/FVC  Value: 48 %     Pred: 73 %    FEF 25-75  Value: 0.36 L/min   Pred: 2.50 L/min     % Pred: 14 %  Post-Spirometry FVC    Value: 2.18 L/min   Pred: 2.93 L/min     % Pred: 74 % FEV1    Value: 1.04 L     Pred: 2.15 L     % Pred: 49 % FEV1/FVC  Value: 48 %     Pred: 73 %    FEF 25-75  Value: 0.39 L/min   Pred: 2.50 L/min     % Pred: 15 %  Lung Volumes TLC    Value: 3.47 L   % Pred: 74 % RV    Value: 1.38 L   % Pred: 78 % DLCO    Value: 9.3 %   % Pred: 48 % DLCO/VA  Value: 3.28 %   % Pred: 87 %  Comments: Severe AFL, 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 Lung volumes show coexisting restriction. Decreased diffusion capacity that corects to normal range for alveolar volume. RSB

## 2010-04-07 NOTE — Progress Notes (Signed)
Summary: speak to nurse  Phone Note Call from Patient Call back at Home Phone [phone]   Caller: Patient Call For: nadel Summary of Call: Wants to speak w TD in reference to reclining chair for medicare. Initial call taken by: Netta Neat,  June 30, 2009 3:57 PM  Follow-up for Phone Call        Spoke with pt.  Pt states she has several medical problems and due to them has been sleeping in a recliner "for years."  States when she lays down in bed her "heart beats faster."  Requesting order from [facility] [employer] for a reclining medical chair.  Will forward to SN - pls advise if ok to put in order for this.  Thanks! Raymondo Band RN  June 30, 2009 5:08 PM   Additional Follow-up for Phone Call Additional follow up Details #1::        per SN---ok for a reclining chair---rx has been written but we need to know where to send this in for her.  thanks [place]  June 30, 2009 5:19 PM     Additional Follow-up for Phone Call Additional follow up Details #2::    Spoke with pt.  Pt informed that we need to know which company she wants to use so we can send the order to them for the reclining chair.  She verbalized understanding and stated she would call us back with this info.  Will sign off on this note.  Raymondo Band RN  June 30, 2009 5:28 PM

## 2010-04-07 NOTE — Progress Notes (Signed)
Summary: sch right heart cath   Phone Note From Other Clinic   Caller: rhonda in pulmonary Summary of Call: per Dr Lenna Gilford pt needs right heart cath w/Dr Bensimhon for pulm htn, pt can be reached at [phone], will discuss available time w/Dr Bensimhon and call pt to sch, will let Suanne Marker know when done Initial call taken by: Kevan Rosebush, RN,  September 01, 2009 5:35 PM  Follow-up for Phone Call        cath is sch for 7/29 at 8:30, pt is checking on her sch and will call back   pt called back and stated that day was fine, instructions reveiwd w/pt Suanne Marker notified, pt will go to Dr Brendolyn Patty office for labs on 7/26 [facility], RN  September 11, 2009 3:49 PM
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
## 2010-04-07 NOTE — Letter (Signed)
Summary: Medical verification for oxygen/[employer] verification for oxygen/[employer]   Imported By: Phillis Knack 01/01/2010 08:51:08  _____________________________________________________________________  External Attachment:    Type:   Image     Comment:   External Document

## 2010-04-07 NOTE — Progress Notes (Signed)
Summary: MEDICATION- LMTCB x 1  Phone Note Call from Patient   Caller: Patient Call For: [name] Summary of Call: PT NEED TO TALK TO [place] Initial call taken by: Gustavus Bryant,  May 09, 2009 3:54 PM  Follow-up for Phone Call        [facility] Tilden Dome  May 09, 2009 4:26 PM  pt called back.   Follow-up by: Zigmund Gottron,  May 09, 2009 4:34 PM  Additional Follow-up for Phone Call Additional follow up Details #1::        LMOMTCB [place]  May 12, 2009 4:51 PM   Rx for vicodin 5/500 was called into pharmacy by mistake instead of pt regular rx of 10-662m. pt states she tried the 5/500 and it is not helping her neck pain and wants to know if she can have rx for 10-650. Please advise. JRandolph BingCMA  May 12, 2009 4:57 PM      Additional Follow-up for Phone Call Additional follow up Details #2::    per SN---ok to call in the vicodin 10/650 for the pt.  thanks LSavannah May 13, 2009 9:31 AM   [facility]  MJinny BlossomReynolds LPN  March  8, [id number]    called and spoke with pt and she is aware of the hydrocodone 10-650 that has been called to her pharmacy with no additional refills LElita BooneCClarksburg Va [facility] May 13, 2009 4:22 PM

## 2010-04-07 NOTE — Progress Notes (Signed)
Summary: nos appt  Phone Note Call from Patient   Caller: [email or web address]  Call For: nadel Summary of Call: In ref to nos from 8/9, pt to see RB in 4 months. Initial call taken by: Netta Neat,  October 15, 2009 3:07 PM

## 2010-04-07 NOTE — Letter (Signed)
Summary: CMN/[facility]  CMN/[facility]   Imported By: Bubba Hales 10/09/2009 09:46:51  _____________________________________________________________________  External Attachment:    Type:   Image     Comment:   External Document

## 2010-04-07 NOTE — Progress Notes (Signed)
Summary: rx  Phone Note Call from Patient Call back at [phone]   Caller: Patient Call For: Banesa Tristan Reason for Call: Talk to Nurse Summary of Call: want to speak to nurse about SN calling her in Prmerrin 0.669m .  She normally gets from her Gyn, but is having to change due to his new location.  Pt says doctor (SN) said he would. WEtowahInitial call taken by: AZigmund Gottron  Jul 28, 2009 1:49 PM  Follow-up for Phone Call        pt is requestign a [facility] day [facility] while she waits for her 90 day [place] and is having hot flashes. pt is in the middle of changing GYN doctors, but SN has filled this med inthe past. RX sent. pt aware. JRandolph BingCMA  Jul 28, 2009 2:23 PM     Prescriptions: PREMARIN 0.625 MG  TABS (ESTROGENS CONJUGATED) Take 1 tablet by mouth once a day  #30 x 0   Entered by:   JRandolph BingCMA   Authorized by:   SNoralee SpaceMD   Signed by:   JRandolph BingCMA on 07/28/2009   Method used:   [facility] to        [facility]       [address]      [place]  [address]      Ph: [phone]      Fax: [phone]  RxID:   [id number] 0.625 MG  TABS (ESTROGENS CONJUGATED) Take 1 tablet by mouth once a day  #90 x 0   Entered by:   JRandolph BingCMA   Authorized by:   SNoralee SpaceMD   Signed by:   JRandolph BingCMA on 07/28/2009   Method used:   Faxed to ...       [facility]             ,          Ph: [phone]      Fax: [phone]  RxID:   [id number]

## 2010-04-07 NOTE — Progress Notes (Signed)
Summary: prescript-LMTCB x 4  Phone Note Call from Patient Call back at [phone]   Caller: Patient Call For: nadel Summary of Call: need ambiem cr for 4 days [facility] [place] Initial call taken by: Gustavus Bryant,  December 26, 2009 10:42 AM  Follow-up for Phone Call        [facility] [place] Dome  December 26, 2009 11:35 AM  Spoke with pt.  She states that she had been taking ambien cr per Dr Thea Silversmith, however he no longer sees her and she now sees Dr Erling Cruz.  She states that she tried to get Dr Erling Cruz to refill Lorrin Mais cr and he said that he would only give her rx for ambien 10 mg b/c this is what was on her med list and we also have that she only takes the plain ambien 10 mg.  I advised that I would ask SN about this.  Pt states "never mind, I just want to know if there is a sleep med i can buy over the counter that's safe"- pls advise thanks  NKDA Follow-up by: [place],  December 26, 2009 12:07 PM  Additional Follow-up for Phone Call Additional follow up Details #1::        per SN--otc sleep aides include--tylenol pm, benadryl 74m and melatonin.   thanks LCascade December 26, 2009 2:31 PM   LMTCBx1.JRandolph BingCMA  December 26, 2009 3:03 PM  lmomtcb x 2. JIran PlanasCMA  December 29, 2009 2:28 PM     Additional Follow-up for Phone Call Additional follow up Details #2::    ATC pt again, NA, 4th attempt to reach this pt so LMOM advising to pls call back if there is something that we can help her with. Follow-up by: LTilden Dome  December 30, 2009 9:57 AM   Appended Document: prescript-LMTCB x 4 Pt called back and I spoke with her and advised that she can try melatonin, benadryl 25 mg, or tylenol pm as an otc sleep aid.  Pt verbalized understanding.

## 2010-04-07 NOTE — Letter (Signed)
Summary: [facility]  [address], [place] [address]   Phone: [phone]  Fax: [phone]    01/20/2010  [address], [place]  [address]  Dear [name],   We have been trying to contact you about an appointment that you have scheduled with Dr. Lenna Gilford on January 30, 2010.   We need to reschedule this appointment due to Dr. Lenna Gilford will not be in the office that day.    Please call me to reschedule this appointment.      Sincerely,    Elita Boone CMA

## 2010-04-07 NOTE — Progress Notes (Signed)
Summary: FYI  Phone Note Call from Patient   Caller: Patient Call For: nadel Summary of Call: 08/17/2009 is date for sleep study 8:00pm.  - Also just FYI - she has quit smoking.  Hasn't had one since she last saw you. Initial call taken by: Zigmund Gottron,  Jul 21, 2009 8:50 AM  Follow-up for Phone Call        FYI for Dr. Lenna Gilford. [place] Bing CMA  Jul 21, 2009 9:13 AM   called to speak with pt but no answer and no machine to leave a message----we changed her appt from 6-6 to 6-27 since her sleep study is not until 6-12 and SN wanted to see her after this has been done and to be able to have the results. Elita Boone CMA  Jul 21, 2009 9:54 AM   Additional Follow-up for Phone Call Additional follow up Details #1::        called pt again but no answer and no machine to leave a message.  will try back later. Elita Boone CMA  Jul 23, 2009 8:47 AM    no answer or no machine to leave a message Elita Boone CMA  Jul 25, 2009 3:15 PM

## 2010-04-08 ENCOUNTER — Encounter (INDEPENDENT_AMBULATORY_CARE_PROVIDER_SITE_OTHER): Payer: 59

## 2010-04-08 ENCOUNTER — Encounter: Payer: Self-pay | Admitting: Pulmonary Disease

## 2010-04-08 DIAGNOSIS — J449 Chronic obstructive pulmonary disease, unspecified: Secondary | ICD-10-CM

## 2010-04-08 DIAGNOSIS — I2789 Other specified pulmonary heart diseases: Secondary | ICD-10-CM
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
## 2010-04-09 NOTE — Progress Notes (Signed)
Summary: wants to retake breathing test  Phone Note Call from Patient   Caller: Patient Call For: dr Lenna Gilford Summary of Call: Patient phoned stated she was seen yesterday and forgot to ask if she could retake the breathing test. Patient can be reached at [phone]  Initial call taken by: Ozella Rocks,  March 27, 2010 2:43 PM  Follow-up for Phone Call        Spoke with pt.  She states that she feels like her breathing is improved since last had her PFT's done and would like to repeat this test.  Then, she started talking about some medication-? redux that SN gave her a long time ago that was recalled and how if she wants to get her "bills paid for" she has to have another PFT done and it has to show that her lung function is at least 60%.  Pls advise thanks Follow-up by: Tilden Dome,  March 27, 2010 3:44 PM  Additional Follow-up for Phone Call Additional follow up Details #1::        Need to sched FULL PFT w/ lung vol & diffusion... Additional Follow-up by: Noralee Space MD,  March 29, 2010 10:10 AM     Appended Document: wants to retake breathing test called and lmomtcb for pt on 1-23 to schedule pft per pts request  Appended Document: wants to retake breathing test attempted to call pt again to schedule pft per pts request---no answer and unable to leave a message this time--will try back again later  Appended Document: wants to retake breathing test looks like the pt called back and scheduled her pft for 1-30 at 1 pm for repeat pft per pts request

## 2010-04-09 NOTE — Progress Notes (Signed)
Summary: cough > zpak  Phone Note Call from Patient Call back at [facility] Phone [phone]   Caller: Patient Call For: nadel Summary of Call: Pt request z-pak for her bronchitis.//[facility] Initial call taken by: Netta Neat,  February 26, 2010 11:19 AM  Follow-up for Phone Call        Spoke with pt.  She is c/o prod cough with moderate amount of dark brown to green sputum.  She also has had " little fever".  Wants zpack called in.  NKDA.  Pls advise thanks! Follow-up by: Tilden Dome,  February 26, 2010 2:21 PM  Additional Follow-up for Phone Call Additional follow up Details #1::        per SN   ok for pt to have zpak #1  take as directed with no refills. thanks [place]  February 26, 2010 4:21 PM   Called, spoke with pt.  She is aware of above recs per SN and aware rx sent to [facility].  She verbalized understanding Additional Follow-up by: Raymondo Band RN,  February 26, 2010 4:24 PM    New/Updated Medications: ZITHROMAX Z-PAK 250 MG TABS (AZITHROMYCIN) take as directed Prescriptions: ZITHROMAX Z-PAK 250 MG TABS (AZITHROMYCIN) take as directed  #1 x 0   Entered by:   Raymondo Band RN   Authorized by:   Noralee Space MD   Signed by:   Raymondo Band RN on 02/26/2010   Method used:   Electronically to        [facility] (retail)       [address]       [place], [place]  [address]       Ph: [phone]       Fax: [phone]   RxID:   [id number]

## 2010-04-15 NOTE — Miscellaneous (Signed)
Summary: Orders Update pft charges   Clinical Lists Changes  Orders: Added new Service order of Carbon Monoxide diffusing w/capacity [phone]) - Signed Added new Service order of Lung Volumes/Gas dilution or washout ([id number]) - Signed Added new Service order of Spirometry (Pre & Post) [phone]) - Signed

## 2010-05-11 ENCOUNTER — Telehealth (INDEPENDENT_AMBULATORY_CARE_PROVIDER_SITE_OTHER): Payer: Self-pay | Admitting: *Deleted

## 2010-05-19 NOTE — Progress Notes (Signed)
Summary: [facility] calling regarding vytorin-[facility] CLOSED CALL BACK 3/6  Phone Note From Pharmacy   Caller: Mary with [facility] Call For: Lenna Gilford  Summary of Call: mary with [facility] called regarding her Vytorin. [facility] needs to speak with a nurse regarding the renewal for Vytorin. Please call the pharmaicst at [place] reference number [phone] they are there from 9 to 5:30 Initial call taken by: Ozella Rocks,  May 11, 2010 3:57 PM  Follow-up for Phone Call        Pharmacist wanted to make sure Dr Lenna Gilford is aware of possible side effects of VYtorin over 62m and Amlodpine.  Informed that Dr NLenna Gilfordis aware and pt is not c/o side effects at this time. DDoroteo GlassmanRN  May 12, 2010 9:09 AM

## 2010-05-22 ENCOUNTER — Telehealth: Payer: Self-pay | Admitting: Pulmonary Disease

## 2010-05-23 LAB — POCT I-STAT 3, ART BLOOD GAS (G3+)
Bicarbonate: 21.9 mEq/L (ref 20.0–24.0)
O2 Saturation: 86 %
TCO2: 24 mmol/L (ref 0–100)
pCO2 arterial: 58.5 mmHg (ref 35.0–45.0)
pH, Arterial: 7.182 — CL (ref 7.350–7.400)

## 2010-05-23 LAB — POCT I-STAT 3, VENOUS BLOOD GAS (G3P V)
Acid-Base Excess: 3 mmol/L — ABNORMAL HIGH (ref 0.0–2.0)
Acid-Base Excess: 6 mmol/L — ABNORMAL HIGH (ref 0.0–2.0)
Bicarbonate: 32.3 mEq/L — ABNORMAL HIGH (ref 20.0–24.0)
O2 Saturation: 51 %
O2 Saturation: 56 %
TCO2: 37 mmol/L (ref 0–100)
pO2, Ven: 34 mmHg (ref 30.0–45.0)

## 2010-05-26 NOTE — Progress Notes (Signed)
Summary: talk to nurse  Phone Note Call from Patient Call back at [phone]   Caller: Patient Call For: Ryanne Morand Reason for Call: Talk to Nurse Summary of Call: States she wants to speak to the nurse about an earlier phone call, refused to give any details. Initial call taken by: Netta Neat,  May 22, 2010 4:39 PM  Follow-up for Phone Call        called pt and she states she would have to call back at a later date to discuss her issues with [facility] because she is so upset at the moment she cannto talk and she hung-up. Walland Bing CMA  May 22, 2010 5:04 PM

## 2010-06-04 ENCOUNTER — Telehealth: Payer: Self-pay | Admitting: Pulmonary Disease

## 2010-06-04 DIAGNOSIS — I272 Pulmonary hypertension, unspecified: Secondary | ICD-10-CM

## 2010-06-04 NOTE — Telephone Encounter (Signed)
Spoke with pt.  She states that she is unhappy with cost and customer service at [facility] and wants to switch to a different MDR who will supply the smallest portable o2 system. Will send order to [facility].

## 2010-06-05 ENCOUNTER — Other Ambulatory Visit: Payer: Self-pay | Admitting: Pulmonary Disease

## 2010-07-21 NOTE — Assessment & Plan Note (Signed)
[facility]                             PULMONARY OFFICE NOTE   NAME:[name] [name], [name] [name]                     MRN:          [id number]  DATE:01/16/2007                            DOB:          1950/09/13    HISTORY OF PRESENT ILLNESS:  The patient is a 60 year old white female  patient of Dr. Jeannine Kitten who has a known history of asthmatic bronchitis,  hypertension, hyperlipidemia, chronic pain syndrome, with fibromyalgia  and degenerative joint disease, and cervical spine disease.  The patient  presents today for follow-up and medication refill.  The patient has  recently run out of her Vicodin prescription and needs a refill.  The  patient uses 3-4 Vicodin a day for chronic pain.  The patient has not  been seen in the office in greater than six months.  The patient is also  due for her follow-up blood work for cholesterol as well.  The patient  denies any chest pain, palpitations, nausea, vomiting, weight loss, or  leg swelling.  The patient complains that she has chronic pain on a  daily basis, but Vicodin seems to decrease this substantially and allows  her to perform her normal activities without significant pain.  The  patient complains that she has significant aches, pains, especially  around the neck and shoulder area.  The patient is followed by  psychiatry as well.   PAST MEDICAL HISTORY:  Reviewed.   CURRENT MEDICATIONS:  Reviewed.   PHYSICAL EXAMINATION:  The patient is a female in no acute distress.  She is afebrile with stable vital signs.  HEENT:  Unremarkable.  NECK:  Supple without cervical adenopathy.  No JVD.  LUNGS:  Lung sounds are diminished at the bases, otherwise clear.  CARDIAC:  Regular rate.  ABDOMEN:  Soft, nontender.  No palpable hepatosplenomegaly.  No guarding  or rebound noted.  EXTREMITIES:  Warm without any edema.   IMPRESSION/PLAN:  1. Chronic pain syndrome with fibromyalgia.  The patient has had an      extensive  workup with neurology, rheumatology, orthopedics,      psychiatry, and the pain clinic.  The patient was given one month      of Vicodin 10/650 to use one up to every six hours, not to exceed      four tablets a day with no refills.  The patient is advised on pain      reducing techniques and is to follow back up in 2-4 weeks with Dr.      Lenna Gilford, or sooner if needed.  2. Hyperlipidemia.  The patient is currently on Vytorin 80/10.  She      will continue on low fat, low      cholesterol diet.  Fasting lipid panel will be performed today.  In      follow-up the patient will discuss lab results with Dr. Lenna Gilford on      return in two weeks.      Rexene Edison, NP  Electronically Signed      [name]. Lenna Gilford, MD  Electronically Signed   TP/MedQ  DD: 01/16/2007  DT: 01/17/2007  Job #: [id number]

## 2010-07-24 NOTE — Op Note (Signed)
[facility]  Patient:    [name] [name], [name] [name] Visit Number: [id number] MRN: [id number]          Service Type: SUR Location: [id number] Attending Physician:  Konrad Dolores Dictated by:   Thana Farr Lorin [name], M.D. Proc. Date: 02/20/01 Admit Date:  02/20/2001                             Operative Report  PREOPERATIVE DIAGNOSIS:  C5-6 herniated nucleus pulposus, C6-7 spondylosis.  POSTOPERATIVE DIAGNOSIS:  C5-6 herniated nucleus pulposus, C6-7 spondylosis.  OPERATION PERFORMED:  C5-6, C6-7 anterior cervical diskectomy and fusion, left iliac crest bone graft.  SURGEON:  [name] [name], M.D.  ASSISTANT:  Melina Copa. Maryjean Ka, M.D.  SECOND ASSISTANT:  Benjiman Core, PA-C  ANESTHESIA:  GOT.  ESTIMATED BLOOD LOSS:  200 ml.  INDICATIONS FOR PROCEDURE:  This 60 year old female has left upper extremity weakness with wrist extension weakness as well as slight triceps weakness. MRI showed large HNP central and left with compression slight to moderate in the midline and severe on the left spinal cord.  C6-7 had significant spondylitic disease.  DESCRIPTION OF PROCEDURE:  After induction of general anesthesia, orotracheal intubation, head halter traction, sand bag behind the neck, arms tucked to the side and Gelbag behind the hip, the left iliac crest and neck was prepped and preoperative Ancef was used prophylactically.  The usual draping was performed with sterile Mayo stand at the head and skin fold was marked with the sterile skin marker.  Incision was made starting at the midline and extending to the left.  The platysma was divided in line with its fibers and tissue underneath the platysma was elevated.  Blunt dissection was performed with the carotid sheath palpable and palpable pulse lateral.  The longus colli was split in the midline and was visualized and self-retaining retractors were placed after cross-table lateral x-ray confirmed that the  short 25 needle was in the C5-6 space.  The toothed retractor was placed in C5-6.  Central hole was drilled for dowel plug after a diskectomy was performed with a 15 scalpel blade and pituitaries.  Cloward curets were used to strip the gutters and drilling proceeded back to the posterior cortex.  The operating microscope was draped and brought in and under direct microscopic visualization using microscopic techniques, the posterior longitudinal ligament was taken down using 1 and 2 mm Kerrisons.  Foraminotomies were performed on both sides and there was significant amount of disk material which was primarily retained by the ligament causing compression of the cord.  Once these spurs were removed, the cord was seen to be well decompressed and bulging into the operative field. Sweeps were made but there were no extruded fragments behind the posterior longitudinal ligament.  Both gutters were stripped clean of all cartilage and a 14 mm plug was harvested from the left iliac crest splitting the fascia in line with its fibers through a single split.  Both plugs were obtained.  Identical procedure was repeated at C6-7 level.  At this level, bone apposition was significant with a very narrow disk space and posterior vertebral bodies had a 1 mm space in between them.  Spurs were removed on both sides.  Gutters were stripped and posterior longitudinal ligament was taken down.  Dura was intact with no extruded fragments once the spurs were removed and both gutters had been decompressed with a 1 and 2 mm Kerrison.  14 mm plug was taken from the single split previously made just anterior to the previous plug leaving a thin bridge of bone in between.  After irrigation with saline solution, the hip was closed with #1 Vicryl in the fascia, 2-0 in the subcutaneous tissues, 3-0 reapproximation and combination of skin staples and Steri-Strips.   Marcaine was infiltrated in the hip.  Hemovac drain was  placed in the neck in line with the skin incision.  3-0 Vicryl in the subcutaneous tissues, 4-0 Vicryl subcuticular skin closure and Xeroform, 4 x 4s, tape and soft collar.  Iliac crest had soft dressing applied.  The patient was transferred to the recovery room neurologically intact. Dictated by:   Thana Farr Lorin [name], M.D. Attending Physician:  Konrad Dolores DD:  02/20/01 TD:  02/20/01 Job: [id number]

## 2010-07-24 NOTE — Assessment & Plan Note (Signed)
[facility]                             PULMONARY OFFICE NOTE   NAME:[name] [name] JIAN                     MRN:          [id number]  DATE:06/09/2006                            DOB:          May 06, 1950    Patient is a 60 year old white female patient of Dr. Lenna Gilford who has a  known history of asthmatic bronchitis, hypertension, hyperlipidemia,  chronic pain syndrome with fibromyalgia.  Patient presents for 7-month routine office visit.  Patient also needs her disability papers filled  out, which were done with patient's assistant.  Patient, since last  visit, reports she has been doing well up until the last week, when she  developed nasal congestion, sinus pain and pressure with thick green  nasal discharge.  Patient denies any chest pain, shortness of breath,  orthopnea, PND, leg swelling, palpitations, nausea, vomiting, or bloody  stools.   PAST MEDICAL HISTORY:  Reviewed.   CURRENT MEDICATIONS:  Reviewed.   PHYSICAL EXAMINATION:  Patient is a pleasant female, in no acute  distress.  She is afebrile with stable vital signs.  Her O2 saturation is 94% on  room air.  HEENT:  Nasal mucosa with some mild erythema and maxillary sinus  tenderness.  Posterior pharynx is clear.  NECK:  Supple without cervical adenopathy.  No JVD.  LUNG SOUNDS:  Clear.  CARDIAC:  Regular rate and rhythm.  ABDOMEN:  Soft and non-tender.  No palpable hepatosplenomegaly.  EXTREMITIES:  Warm without any calf cyanosis, clubbing, or edema.   IMPRESSION AND PLAN:  1. Hyperlipidemia.  Patient is presently on Vytorin, and was recently      not at goal with an LDL of 154.  Patient reports that she has been      doing better with her diet.  We will check a fasting lipid panel      today, and follow up on the results.  2. Hypertension.  Currently well controlled.  3. Gastroesophageal reflux.  Patient is well controlled on brand name      Prilosec daily.  Patient has been  intolerant to generic Prilosec in      the past.  She will continue on weight loss and antireflux      preventative measures.  4. Chronic pain syndrome with fibromyalgia.  Patient is to continue on      her present regimen.  Patient was given a prescription of Lorcet,      number 120 tablets, 1 to be used every 6 hours as needed.  Patient      has agreement not to exceed more than 4 tablets a day, and 131-month    supply was given.  Patient's disability forms were also filled out,      and a copy was placed in the chart.  Patient will follow back up      with Dr. NaLenna Gilfordn 4 to 6 months, or sooner if needed.      TaRexene EdisonNP  Electronically Signed      ScDeborra MedinaNaLenna GilfordMD  Electronically Signed   TP/MedQ  DD: 06/09/2006  DT: 06/09/2006  Job #: [id number]

## 2010-07-28 ENCOUNTER — Encounter: Payer: Self-pay | Admitting: Emergency Medicine

## 2010-07-29 ENCOUNTER — Encounter: Payer: Self-pay | Admitting: Emergency Medicine

## 2010-07-29 ENCOUNTER — Ambulatory Visit (INDEPENDENT_AMBULATORY_CARE_PROVIDER_SITE_OTHER): Payer: 59 | Admitting: Emergency Medicine

## 2010-07-29 VITALS — BP 172/90 | HR 75 | Temp 97.7°F | Ht 63.0 in | Wt 174.2 lb

## 2010-07-29 DIAGNOSIS — I2789 Other specified pulmonary heart diseases: Secondary | ICD-10-CM

## 2010-07-29 NOTE — Progress Notes (Signed)
  Subjective:    Patient ID: [name] [name], female    DOB: 08/03/1950, 60 y.o.   MRN: [id number]  HPI 60 y/o WF, positive tobacco and COPD, anxiety d/o, chronic pain, history of HTN, and hyperlipidemia, and anxiety, chronic reflux. Seen in past by Dr Doreatha Lew 2002 and found to have PASP ~ 45-50 mmHg on TTE. Repeat TTE with estimated PASP 70 mmHg. She does report exposure to dexfenfluromine for about 3 months (was not on fen-phen) in early 2000's (she can't remember when). ANA negative.   ROV 05/27/09 -- PFT's 04/24/09 showed severe obstruction + probable restriction. She never wore her supplimental O2 because she felt better. She cancelled her R heart cath. Continues to take Advair. She has cut her cigarettes down to 1 -2 cigarettes a day. We rechecked autoimmune labs - ANA, aScl70, a-ds-DNA negative. RF borderline positive. She mentions today that she had formerly been adderal, stopped it many mo ago, wonders if this had anything to do with her breathing problems. She feels that her exertional tolerance is good, not sure she needs O2 or a R heart cath.   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 Since last visit, she has decided to undergo R heart cath (7/29) showing mean PAP 41, PAOP 10. She quit smoking in May 2011! Tells me that she is using O2 with some exertion but not all. She doesn't feel very limited by her breathing. She is able to do most activities, but has curtailed some things - does chores but slowly, some SOB with climbing several flights of stairs.   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 PAP 41 with normal wedge. No longer smoking (quit a yr ago). She is using Advair. Rare albuterol use. She is not interested in trying to treat PAH at this time.    Review of Systems ROS - has been very active, no real exertional SOB. She does have daily cough. Some GERD symptoms. Denies any CP, no syncope of  pre-syncope. No chest pain.      Objective:   Physical Exam Gen: Pleasant, well-nourished, in no distress,  normal affect  ENT: No lesions,  mouth clear,  oropharynx clear, no postnasal drip  Neck: No JVD, no TMG, no carotid bruits  Lungs: No use of accessory muscles, no dullness to percussion, clear without rales or rhonchi  Cardiovascular: RRR, heart sounds normal, no murmur or gallops, no peripheral edema  Musculoskeletal: No deformities, no cyanosis or clubbing  Neuro: alert, non focal  Skin: Warm, no lesions or rashes        Assessment & Plan:

## 2010-07-29 NOTE — Patient Instructions (Signed)
We will not start any medication for pulmonary hypertension at this time CONGRATULATIONS on stopping smoking! You should try to wear your oxygen with all exertion.  Follow up with Dr Lenna Gilford as planned Follow up with Dr Lamonte Sakai in 1 year or sooner if you have any problems.

## 2010-07-29 NOTE — Assessment & Plan Note (Signed)
Likely due to her COPD +/- her diet pill exposure.  - encouraged her to wear o2 with exertion - I don';t believe we should pursue revatio or any other PAH meds at this time. Compliance would be an issue, and I don;t think benefits outweigh the risks.  - congratulated her smoking cessation - Agree with he Advair as ordered.  - ROV 1 yr or prn

## 2010-08-08 ENCOUNTER — Other Ambulatory Visit: Payer: Self-pay | Admitting: Pulmonary Disease

## 2010-08-13 ENCOUNTER — Telehealth: Payer: Self-pay | Admitting: Pulmonary Disease

## 2010-08-13 MED ORDER — HYDROCODONE-ACETAMINOPHEN 10-650 MG PO TABS: 1.0000 | ORAL_TABLET | Freq: Four times a day (QID) | ORAL | Status: DC | PRN

## 2010-08-13 NOTE — Telephone Encounter (Signed)
Pt requesting hydrocodone 10/650 rx - [facility] [place].  Pt would also like SN to know she has still not smoked -- states it has been over a year.  Last OV with SN 03/25/10 Rx last given on 06/05/10 #120 x 1  Pls advise if rx ok.  Thanks!

## 2010-08-13 NOTE — Telephone Encounter (Signed)
Yes ok to refill--pt has appt in July with SN that she will need to keep for refills. thanks

## 2010-08-13 NOTE — Telephone Encounter (Signed)
Called and spoke with pt.  Pt aware rx sent to pharmacy and to keep pending appt.

## 2010-08-18 ENCOUNTER — Telehealth: Payer: Self-pay | Admitting: Pulmonary Disease

## 2010-08-18 MED ORDER — OMEPRAZOLE 20 MG PO CPDR: 20.0000 mg | DELAYED_RELEASE_CAPSULE | Freq: Every day | ORAL | Status: DC

## 2010-08-18 MED ORDER — OMEPRAZOLE 20 MG PO CPDR: 20.0000 mg | DELAYED_RELEASE_CAPSULE | Freq: Two times a day (BID) | ORAL | Status: DC

## 2010-08-18 NOTE — Telephone Encounter (Signed)
Called and spoke with pt.  Pt states she quit smoking and has gained weight and has noticed she is having increased indigestion. Pt states she is currently on Omeprazole 83m daily.  Pt states for the past few days she has increased the Omeprazole to 253mtwice a day and states this helps with her indigestion.  Pt wanted to check with SN to make sure this was ok to increase the Omeprazole to bid.  Pt states she has enough tabs to last her until her pending appt with SN on 7/16.  Please advise.  Thanks.

## 2010-08-18 NOTE — Telephone Encounter (Signed)
Per SN----increase the prilosec bid   #60  And refill x 11.  Thanks.

## 2010-08-18 NOTE — Telephone Encounter (Signed)
Spoke with pt and notified of recs per SN. Rx sent to [facility] per pt request.

## 2010-08-20 ENCOUNTER — Telehealth: Payer: Self-pay | Admitting: Pulmonary Disease

## 2010-08-20 NOTE — Telephone Encounter (Signed)
Called and spoke with [place] for [facility].  He states they received rx on 6/12 for Omeprazole 32m qd but for #180 x 3 refills.  Calling for clarification.  Informed Derek, per phone note on 6/12, SN ok'd to increase pt's omeprazole 231mto 2 tabs daily .  Derek verbalized understanding and Nothing further needed.

## 2010-08-21 ENCOUNTER — Telehealth: Payer: Self-pay | Admitting: Pulmonary Disease

## 2010-08-21 DIAGNOSIS — I272 Pulmonary hypertension, unspecified: Secondary | ICD-10-CM

## 2010-08-21 NOTE — Telephone Encounter (Signed)
Spoke with pt. She states that she is wanting to try to get portable o2 system from a company called [employer]. Order sent to [facility] to see about this.

## 2010-09-11 ENCOUNTER — Other Ambulatory Visit: Payer: Self-pay | Admitting: Pulmonary Disease

## 2010-09-21 ENCOUNTER — Other Ambulatory Visit (INDEPENDENT_AMBULATORY_CARE_PROVIDER_SITE_OTHER): Payer: Medicare Other

## 2010-09-21 ENCOUNTER — Ambulatory Visit (INDEPENDENT_AMBULATORY_CARE_PROVIDER_SITE_OTHER): Payer: Medicare Other | Admitting: Pulmonary Disease

## 2010-09-21 ENCOUNTER — Ambulatory Visit (INDEPENDENT_AMBULATORY_CARE_PROVIDER_SITE_OTHER)
Admission: RE | Admit: 2010-09-21 | Discharge: 2010-09-21 | Disposition: A | Discharge status: Home / Self Care | Payer: Medicare Other | Source: Ambulatory Visit | Attending: Pulmonary Disease | Admitting: Pulmonary Disease

## 2010-09-21 ENCOUNTER — Encounter: Payer: Self-pay | Admitting: Pulmonary Disease

## 2010-09-21 DIAGNOSIS — I10 Essential (primary) hypertension: Secondary | ICD-10-CM

## 2010-09-21 DIAGNOSIS — J449 Chronic obstructive pulmonary disease, unspecified: Secondary | ICD-10-CM

## 2010-09-21 DIAGNOSIS — G894 Chronic pain syndrome: Secondary | ICD-10-CM

## 2010-09-21 DIAGNOSIS — E7800 Pure hypercholesterolemia, unspecified: Secondary | ICD-10-CM

## 2010-09-21 DIAGNOSIS — R51 Headache: Secondary | ICD-10-CM

## 2010-09-21 DIAGNOSIS — M5030 Other cervical disc degeneration, unspecified cervical region: Secondary | ICD-10-CM

## 2010-09-21 DIAGNOSIS — I2789 Other specified pulmonary heart diseases: Secondary | ICD-10-CM

## 2010-09-21 DIAGNOSIS — E559 Vitamin D deficiency, unspecified: Secondary | ICD-10-CM

## 2010-09-21 DIAGNOSIS — F411 Generalized anxiety disorder: Secondary | ICD-10-CM

## 2010-09-21 DIAGNOSIS — K219 Gastro-esophageal reflux disease without esophagitis: Secondary | ICD-10-CM

## 2010-09-21 DIAGNOSIS — IMO0001 Reserved for inherently not codable concepts without codable children: Secondary | ICD-10-CM

## 2010-09-21 DIAGNOSIS — M1990 Unspecified osteoarthritis, unspecified site: Secondary | ICD-10-CM

## 2010-09-21 DIAGNOSIS — K589 Irritable bowel syndrome without diarrhea: Secondary | ICD-10-CM

## 2010-09-21 LAB — CBC WITH DIFFERENTIAL/PLATELET
Basophils Absolute: 0 10*3/uL (ref 0.0–0.1)
HCT: 39.6 % (ref 36.0–46.0)
Lymphs Abs: 1.6 10*3/uL (ref 0.7–4.0)
Monocytes Absolute: 0.4 10*3/uL (ref 0.1–1.0)
Monocytes Relative: 5.6 % (ref 3.0–12.0)
Neutrophils Relative %: 72.6 % (ref 43.0–77.0)
Platelets: 188 10*3/uL (ref 150.0–400.0)
RDW: 12.8 % (ref 11.5–14.6)

## 2010-09-21 LAB — TSH: TSH: 0.96 u[IU]/mL (ref 0.35–5.50)

## 2010-09-21 LAB — LIPID PANEL
Cholesterol: 159 mg/dL (ref 0–200)
HDL: 61.5 mg/dL
LDL Cholesterol: 64 mg/dL (ref 0–99)
Total CHOL/HDL Ratio: 3
Triglycerides: 170 mg/dL — ABNORMAL HIGH (ref 0.0–149.0)
VLDL: 34 mg/dL (ref 0.0–40.0)

## 2010-09-21 LAB — HEPATIC FUNCTION PANEL
ALT: 14 U/L (ref 0–35)
AST: 22 U/L (ref 0–37)
Albumin: 4.2 g/dL (ref 3.5–5.2)
Alkaline Phosphatase: 64 U/L (ref 39–117)
Bilirubin, Direct: 0.1 mg/dL (ref 0.0–0.3)
Total Bilirubin: 0.6 mg/dL (ref 0.3–1.2)
Total Protein: 6.9 g/dL (ref 6.0–8.3)

## 2010-09-21 LAB — BASIC METABOLIC PANEL
CO2: 32 mEq/L (ref 19–32)
Chloride: 94 mEq/L — ABNORMAL LOW (ref 96–112)
GFR: 88.53 mL/min (ref >60.00)
Glucose, Bld: 110 mg/dL — ABNORMAL HIGH (ref 70–99)
Potassium: 4.5 mEq/L (ref 3.5–5.1)
Sodium: 133 mEq/L — ABNORMAL LOW (ref 135–145)

## 2010-09-21 MED ORDER — TIOTROPIUM BROMIDE MONOHYDRATE 18 MCG IN CAPS: 18.0000 ug | ORAL_CAPSULE | Freq: Every day | RESPIRATORY_TRACT | Status: DC

## 2010-09-21 NOTE — Patient Instructions (Signed)
Today we updated your med list in EPIC...    We rec that you get back on the [facility] & use it once daily...    Continue your other meds the same...  Today we did your follow up CXR & fasting blood work...    Please call the [facility]ONE [facility]REE in a few days for your results...    Dial [phone] & when prompted enter your patient number followed by the # symbol...    Your patient number is:  [id number]#  Use your Oxygen more regularly, esp when you walk about & exercise...  Call for any questions...  Let's plan another follow up visit in 6 months.Marland KitchenMarland Kitchen
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
## 2010-09-22 ENCOUNTER — Telehealth: Payer: Self-pay | Admitting: Pulmonary Disease

## 2010-09-22 ENCOUNTER — Encounter: Payer: Self-pay | Admitting: Pulmonary Disease

## 2010-09-22 NOTE — Telephone Encounter (Signed)
Called and spoke with [employer] and they are aware that the form has been received and we will give the form to SN to sign and will fax this back for pt to get her concentrator.

## 2010-09-22 NOTE — Progress Notes (Signed)
Addended by: Elie Confer on: 09/22/2010 02:37 PM   Modules accepted: Orders

## 2010-09-23 ENCOUNTER — Telehealth: Payer: Self-pay | Admitting: Pulmonary Disease

## 2010-09-23 NOTE — Telephone Encounter (Signed)
RB, pls advise if form has been received thanks!

## 2010-09-24 NOTE — Telephone Encounter (Signed)
Called and spoke with Barbaraann Share from [employer] and informed her we are waiting on SN to fill out and sign paperwork and once completed we will fax back to her. Nothing further needed.

## 2010-09-24 NOTE — Telephone Encounter (Signed)
Pt called again re: portable "pulse" O2 concentrator from [employer]. Says medicare will pay for this if dr byrum orders this for pt. Call pt at mobile #. [name] [name]

## 2010-09-24 NOTE — Telephone Encounter (Signed)
This form was sent to SN to fill out. It is on his cart waiting for his signature.  Once this has been done we will get it faxed back.  thanks

## 2010-09-24 NOTE — Telephone Encounter (Signed)
[name] calling again about paperwork status.[name] [name]

## 2010-09-25 ENCOUNTER — Telehealth: Payer: Self-pay | Admitting: Pulmonary Disease

## 2010-09-25 NOTE — Telephone Encounter (Signed)
Called and spoke with pt and she stated that she got a headache yesterday and some dizziness---took a half of tablet yesterday of the atenolol and then today she got dizzy, headache and face red so she took today 1/2 tablet of the atenolol today.  She is wondering if she should just lower the dose of the atenolol instead of just stopping it all together.  She is thinking that it is not good for her to stop this atenolol all together.  SN please advise.   thanks

## 2010-09-25 NOTE — Telephone Encounter (Signed)
Called and spoke with pt and she is aware per SN to start on the atenolol 22m   1/2 tablet by mouth once daily. Pt is aware of this and will start on this dose daily.

## 2010-09-25 NOTE — Telephone Encounter (Signed)
lmomtcb for pt 

## 2010-09-28 ENCOUNTER — Telehealth: Payer: Self-pay | Admitting: Pulmonary Disease

## 2010-09-28 NOTE — Telephone Encounter (Signed)
lmomtcb

## 2010-09-29 NOTE — Telephone Encounter (Signed)
[email or web address]

## 2010-09-30 ENCOUNTER — Other Ambulatory Visit: Payer: Self-pay | Admitting: Pulmonary Disease

## 2010-09-30 ENCOUNTER — Telehealth: Payer: Self-pay | Admitting: Pulmonary Disease

## 2010-09-30 NOTE — Telephone Encounter (Signed)
lmtcb and will sign per protocol.

## 2010-10-01 NOTE — Telephone Encounter (Signed)
Please call pt at [phone]. Pt called cause she hasn't heard anything. She has been talking to judd at [employer]. If you need to talk to him.

## 2010-10-01 NOTE — Telephone Encounter (Signed)
Called and spoke with pt and she is aware that corrections had to be made to the form and this will be done today and faxed back.

## 2010-10-01 NOTE — Telephone Encounter (Signed)
There needs to be corrections on #5 needs to be 24 hours or as needed not nighttime only. And #10 needs to be yes. Both corrections need to be initialed by Dr. Lenna Gilford and dated. I do not see this form scanned into the system so I will forward message to Leigh to see if she has the forms. [place] Bing, CMA

## 2010-10-02 ENCOUNTER — Telehealth: Payer: Self-pay | Admitting: Pulmonary Disease

## 2010-10-02 NOTE — Telephone Encounter (Signed)
Dates have been added to the form and faxed back.  Called and spoked with Varney Biles  And she is aware that this form has been faxed back.

## 2010-10-02 NOTE — Telephone Encounter (Signed)
[name] [name] returned call.  She states that SN initialed all the correct areas but they need dates on them.  [name] [name] states that nurse can date these and fax back to [phone].  [name] [name] triage fax number.  Will hold in leigh's box.

## 2010-10-02 NOTE — Telephone Encounter (Signed)
Called [employer] and LM for Keisha at her ext [phone] for her TCB.

## 2010-10-29 ENCOUNTER — Telehealth: Payer: Self-pay | Admitting: Pulmonary Disease

## 2010-10-29 NOTE — Telephone Encounter (Signed)
Spoke with pt. She states needs letter stating that she can fly on airplane with o2. She keeps mentioning a "form" that needs to be filled out. She states that she uses [facility], but then states still trying to get o2 from [employer]. She then states unsure of what really needed now and [facility] when she is clear on which o2 company letter needs to go to and what "form" is needed.

## 2010-11-10 ENCOUNTER — Telehealth: Payer: Self-pay | Admitting: Critical Care Medicine

## 2010-11-10 NOTE — Telephone Encounter (Signed)
Pt says her Atenolol was stopped but she has been taking 1/2 a pill on occasion when her BP runs a little high. Readings have been 157/78, 133/72 and 144/76 the past few days. She wants to know if this is okay with Dr. Lenna Gilford. She is also requesting a letter from [facility] stating that she will be carrying her portable oxygen in her suitcase while flying. She says [employer] does not need any forms filled out regarding the oxygen unless she needs to use it on the plane. I have asked that the pt re-check with the airline on their policy regarding oxygen tanks and use while flying. Pls advise on BP readings and Atenolol use.

## 2010-11-11 ENCOUNTER — Encounter: Payer: Self-pay | Admitting: *Deleted

## 2010-11-11 NOTE — Telephone Encounter (Signed)
Pt aware of recs. Maryhill Bing, CMA

## 2010-11-11 NOTE — Telephone Encounter (Signed)
Per SN----ok for atenolol 56m   #30   1/2 tab po prn as directed and ok for the letter that has already been printed out and left up front for the pt to pick up.  thanks

## 2010-11-18 ENCOUNTER — Other Ambulatory Visit: Payer: Self-pay | Admitting: *Deleted

## 2010-11-18 MED ORDER — FLUTICASONE-SALMETEROL 250-50 MCG/DOSE IN AEPB: 1.0000 | INHALATION_SPRAY | Freq: Two times a day (BID) | RESPIRATORY_TRACT | Status: DC

## 2010-11-25 ENCOUNTER — Telehealth: Payer: Self-pay | Admitting: Pulmonary Disease

## 2010-11-25 MED ORDER — ALPRAZOLAM 0.5 MG PO TABS: 0.5000 mg | ORAL_TABLET | Freq: Three times a day (TID) | ORAL | Status: DC | PRN

## 2010-11-25 MED ORDER — HYDROCOD POLST-CHLORPHEN POLST 10-8 MG/5ML PO LQCR: ORAL | Status: DC

## 2010-11-25 MED ORDER — AZITHROMYCIN 250 MG PO TABS: ORAL_TABLET | ORAL | Status: AC

## 2010-11-25 MED ORDER — FIRST-DUKES MOUTHWASH MT SUSP: OROMUCOSAL | Status: DC

## 2010-11-25 NOTE — Telephone Encounter (Signed)
Per SN--ok for pt to have zpak #1  Take as directed, MMW   #4oz  1 tsp gargle and swallow qid, tussionex  #4oz   1 tsp po every 12 hours prn cough.  thanks

## 2010-11-25 NOTE — Telephone Encounter (Signed)
PT CALLED BACK. WANTS A RX FOR ALPRAZOLAM- [facility] ON [address]. CALL PT AT HOME # THE CELL. [name] [name]

## 2010-11-25 NOTE — Telephone Encounter (Signed)
SN, pls advise if okay to refill her alprazolam, thanks!

## 2010-11-25 NOTE — Telephone Encounter (Signed)
I spoke with pt and she states she has been experiencing sore throat, dry cough, feels tired, chest is a little tight, sinus congestion, chest tightness x thursday. Pt has been taking aleve. Pt is requesting something be called in. Please advise Dr. Lenna Gilford. Thanks  No Known Allergies  Charma Igo, CMA

## 2010-11-25 NOTE — Telephone Encounter (Signed)
Callled, spoke with pt. She is aware SN recs she take z pak, MMW, and tussionex.  She is also aware SN ok for alprazolam rx.  She is aware rxs will be called into [facility].  She verbalized understanding of these instructions and recs and was very appreciative of this.    All rxs were called into [facility] [address] to [place] who verbalized her understanding.

## 2010-11-25 NOTE — Telephone Encounter (Signed)
Yes ok to refill the alprazolam 0.5  #90  With 5 refills. thanks

## 2010-11-29 ENCOUNTER — Other Ambulatory Visit: Payer: Self-pay | Admitting: Pulmonary Disease

## 2011-01-11 ENCOUNTER — Telehealth: Payer: Self-pay | Admitting: Pulmonary Disease

## 2011-01-11 MED ORDER — OMEPRAZOLE 20 MG PO CPDR: 20.0000 mg | DELAYED_RELEASE_CAPSULE | Freq: Two times a day (BID) | ORAL | Status: DC

## 2011-01-11 NOTE — Telephone Encounter (Signed)
I spoke with pt and she states she needs her rx for omeprazole [facility]. I advised pt rx would be called in. Nothing further was needed

## 2011-02-12 ENCOUNTER — Telehealth: Payer: Self-pay | Admitting: Emergency Medicine

## 2011-02-12 NOTE — Telephone Encounter (Signed)
I spoke with [name] [name] and she is wanting to know if Dr. Lenna Gilford feels like it would be a good idea for her to do "core body workout" at home with her oxygen on. Also [name] [name] wanted to make SN aware she has not been smoking x 2 years now. Please advise Dr. Lenna Gilford, thanks

## 2011-02-12 NOTE — Telephone Encounter (Signed)
Spoke with pt and notified of recs per SN. She verbalized understanding and states nothing further needed.

## 2011-02-12 NOTE — Telephone Encounter (Signed)
Per SN---yes ok to use the core body workout with her oxygen on.  thanks

## 2011-02-17 ENCOUNTER — Encounter: Payer: Self-pay | Admitting: Pulmonary Disease

## 2011-02-17 ENCOUNTER — Ambulatory Visit (INDEPENDENT_AMBULATORY_CARE_PROVIDER_SITE_OTHER): Payer: Medicare Other | Admitting: Pulmonary Disease

## 2011-02-17 DIAGNOSIS — M1990 Unspecified osteoarthritis, unspecified site: Secondary | ICD-10-CM

## 2011-02-17 DIAGNOSIS — J209 Acute bronchitis, unspecified: Secondary | ICD-10-CM

## 2011-02-17 DIAGNOSIS — F411 Generalized anxiety disorder: Secondary | ICD-10-CM

## 2011-02-17 DIAGNOSIS — E559 Vitamin D deficiency, unspecified: Secondary | ICD-10-CM

## 2011-02-17 DIAGNOSIS — K219 Gastro-esophageal reflux disease without esophagitis: Secondary | ICD-10-CM

## 2011-02-17 DIAGNOSIS — G894 Chronic pain syndrome: Secondary | ICD-10-CM

## 2011-02-17 DIAGNOSIS — E7800 Pure hypercholesterolemia, unspecified: Secondary | ICD-10-CM

## 2011-02-17 DIAGNOSIS — J449 Chronic obstructive pulmonary disease, unspecified: Secondary | ICD-10-CM

## 2011-02-17 DIAGNOSIS — I10 Essential (primary) hypertension: Secondary | ICD-10-CM

## 2011-02-17 DIAGNOSIS — K589 Irritable bowel syndrome without diarrhea: Secondary | ICD-10-CM

## 2011-02-17 MED ORDER — LEVOFLOXACIN 500 MG PO TABS: 500.0000 mg | ORAL_TABLET | Freq: Every day | ORAL | Status: DC

## 2011-02-17 MED ORDER — LEVOFLOXACIN 500 MG PO TABS: 500.0000 mg | ORAL_TABLET | Freq: Every day | ORAL | Status: AC

## 2011-02-17 NOTE — Patient Instructions (Signed)
Today we updated your med list in our EPIC system...    Continue your current medications the same...    We added Levaquin 569m daily for 7d for your Bronchitis...    Be sure to continue the Mucinex- 2 tabs twice daily w/ lots of water...  Call for any questions...  Let's plan a follow up visit in 4-6 months w/ FASTING blood work around that time..Marland KitchenMarland Kitchen
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
## 2011-02-18 ENCOUNTER — Telehealth: Payer: Self-pay | Admitting: Pulmonary Disease

## 2011-02-18 NOTE — Telephone Encounter (Signed)
ATC [facility] but was closed will need to call back in the AM

## 2011-02-19 NOTE — Telephone Encounter (Signed)
Called and spoke with [facility] and they are aware to d/c rx for the levaquin.

## 2011-02-22 ENCOUNTER — Other Ambulatory Visit: Payer: Self-pay | Admitting: Pulmonary Disease

## 2011-02-22 DIAGNOSIS — I10 Essential (primary) hypertension: Secondary | ICD-10-CM

## 2011-02-22 DIAGNOSIS — K589 Irritable bowel syndrome without diarrhea: Secondary | ICD-10-CM

## 2011-02-22 DIAGNOSIS — E7800 Pure hypercholesterolemia, unspecified: Secondary | ICD-10-CM

## 2011-02-22 DIAGNOSIS — I2789 Other specified pulmonary heart diseases: Secondary | ICD-10-CM

## 2011-02-22 DIAGNOSIS — J449 Chronic obstructive pulmonary disease, unspecified: Secondary | ICD-10-CM

## 2011-02-22 DIAGNOSIS — E559 Vitamin D deficiency, unspecified: Secondary | ICD-10-CM

## 2011-02-22 DIAGNOSIS — F411 Generalized anxiety disorder: Secondary | ICD-10-CM

## 2011-03-16 ENCOUNTER — Telehealth: Payer: Self-pay | Admitting: Pulmonary Disease

## 2011-03-16 MED ORDER — EZETIMIBE-SIMVASTATIN 10-80 MG PO TABS: 1.0000 | ORAL_TABLET | Freq: Every day | ORAL | Status: DC

## 2011-03-16 NOTE — Telephone Encounter (Signed)
Called and spoke with pt and she is aware that the vytorin has been sent to her pharmacy.  She is aware that we can send in the atenolol 76m  Once she is out of the atenolol 549m.  Pt is aware to call back once she is ready for the new rx to be sent in.

## 2011-03-26 ENCOUNTER — Telehealth: Payer: Self-pay | Admitting: Pulmonary Disease

## 2011-03-26 MED ORDER — ALPRAZOLAM 1 MG PO TABS: 1.0000 mg | ORAL_TABLET | Freq: Three times a day (TID) | ORAL | Status: DC | PRN

## 2011-03-26 NOTE — Telephone Encounter (Signed)
[facility]--lmomtcb to  Make pt aware

## 2011-03-26 NOTE — Telephone Encounter (Signed)
Per SN---call pharmacy and confirm rx--when last filled  #of pills,etc.  Ok to fill alprazolam  #90  1 po tid prn nerves with no refills.  thanks

## 2011-03-26 NOTE — Telephone Encounter (Signed)
I spoke with pt and she states that Dr. Erling Cruz has been giving her alprazolam 1 mg TID. She states they are closed today and she has ran out. Pt states she has started shaking and needs this refilled. Pt is requesting to have SN fill this for her since Dr. Tressia Danas office is closed. Please advise Dr. Lenna Gilford, thanks

## 2011-03-29 MED ORDER — ATENOLOL 25 MG PO TABS: 25.0000 mg | ORAL_TABLET | Freq: Every day | ORAL | Status: DC

## 2011-03-29 NOTE — Telephone Encounter (Signed)
Pt also requested a refill on atenolol. Per last phone note pt was to start atenolol 68m once her atenolol 542mwas gone.  Refill sent. Pt aware. JeRandolph BingCMA

## 2011-04-07 DIAGNOSIS — F411 Generalized anxiety disorder: Secondary | ICD-10-CM | POA: Diagnosis not present

## 2011-04-07 DIAGNOSIS — F4310 Post-traumatic stress disorder, unspecified: Secondary | ICD-10-CM | POA: Diagnosis not present

## 2011-04-07 DIAGNOSIS — F339 Major depressive disorder, recurrent, unspecified: Secondary | ICD-10-CM | POA: Diagnosis not present

## 2011-05-06 ENCOUNTER — Telehealth: Payer: Self-pay | Admitting: Pulmonary Disease

## 2011-05-06 NOTE — Telephone Encounter (Signed)
Spoke with pt. She states would like to come in next wk and have fasting labs done, has rov with SN on 06/23/11. Please advise thanks

## 2011-05-07 NOTE — Telephone Encounter (Signed)
LMTCB

## 2011-05-07 NOTE — Telephone Encounter (Signed)
Pt will need to set her fasting labs up for 1 week prior to her appt in April.  thanks

## 2011-05-10 NOTE — Telephone Encounter (Signed)
Pt aware. [name] [name], CMA  

## 2011-05-11 ENCOUNTER — Ambulatory Visit: Payer: Medicare Other | Admitting: Gynecology

## 2011-05-18 ENCOUNTER — Telehealth: Payer: Self-pay | Admitting: *Deleted

## 2011-05-18 ENCOUNTER — Telehealth: Payer: Self-pay | Admitting: Pulmonary Disease

## 2011-05-18 NOTE — Telephone Encounter (Signed)
Pt called requesting a rx for premarin due to hot flashes and night sweats with mood swings. Pt is Dr. Ree Edman pt and now will seee TF. I explained to pt that TF has not seen pt and will not give medication without OV with new pt first. Pt was okay with this and said she will call Dr. Lenna Gilford office.

## 2011-05-18 NOTE — Telephone Encounter (Signed)
LMOM for pt TCB

## 2011-05-19 MED ORDER — GABAPENTIN 100 MG PO CAPS: ORAL_CAPSULE | ORAL | Status: DC

## 2011-05-19 NOTE — Telephone Encounter (Signed)
Per SN---could try black cohosh otc   And rx for neurontin 114m  1 po qhs  X 1 week then 2 at bedtime thereafter.  Thanks.

## 2011-05-19 NOTE — Telephone Encounter (Signed)
Spoke with pt. She states that she has been off of her premarin for a couple wks since her GYN has retired and she can not get this refilled until sees new GYN on 06/02/11. She states that she is having hot flashes and mood swings. Wants to know if SN will call in a 30 day supply for now. Please advise, thanks!

## 2011-05-19 NOTE — Telephone Encounter (Signed)
Called, spoke with pt.  I informed her of SN's recs and advised neurontin rx will be sent to [facility].  She verbalized understanding of this and nothing further needed at this time.  She will call back if this does not work.

## 2011-05-26 ENCOUNTER — Ambulatory Visit: Payer: Medicare Other | Admitting: Gynecology

## 2011-06-02 ENCOUNTER — Ambulatory Visit: Payer: Medicare Other | Admitting: Gynecology

## 2011-06-14 ENCOUNTER — Ambulatory Visit: Payer: Medicare Other | Admitting: Gynecology

## 2011-06-15 ENCOUNTER — Ambulatory Visit: Payer: Medicare Other | Admitting: Gynecology

## 2011-06-16 ENCOUNTER — Other Ambulatory Visit: Payer: Self-pay | Admitting: Pulmonary Disease

## 2011-06-17 ENCOUNTER — Telehealth: Payer: Self-pay | Admitting: Pulmonary Disease

## 2011-06-17 MED ORDER — ATENOLOL 25 MG PO TABS: 25.0000 mg | ORAL_TABLET | Freq: Every day | ORAL | Status: DC

## 2011-06-17 MED ORDER — OMEPRAZOLE 20 MG PO CPDR: 20.0000 mg | DELAYED_RELEASE_CAPSULE | Freq: Two times a day (BID) | ORAL | Status: DC

## 2011-06-17 MED ORDER — ALBUTEROL SULFATE HFA 108 (90 BASE) MCG/ACT IN AERS: 2.0000 | INHALATION_SPRAY | RESPIRATORY_TRACT | Status: DC | PRN

## 2011-06-17 NOTE — Telephone Encounter (Signed)
Called and spoke with pt and she is aware that these [facility].  Pt has appt with SN in April and she is aware to keep this appt.

## 2011-06-22 ENCOUNTER — Other Ambulatory Visit (INDEPENDENT_AMBULATORY_CARE_PROVIDER_SITE_OTHER): Payer: Medicare Other

## 2011-06-22 DIAGNOSIS — E7800 Pure hypercholesterolemia, unspecified: Secondary | ICD-10-CM

## 2011-06-22 DIAGNOSIS — K589 Irritable bowel syndrome without diarrhea: Secondary | ICD-10-CM

## 2011-06-22 DIAGNOSIS — I10 Essential (primary) hypertension: Secondary | ICD-10-CM

## 2011-06-22 DIAGNOSIS — F411 Generalized anxiety disorder: Secondary | ICD-10-CM

## 2011-06-22 LAB — HEPATIC FUNCTION PANEL
Bilirubin, Direct: 0.1 mg/dL (ref 0.0–0.3)
Total Bilirubin: 0.3 mg/dL (ref 0.3–1.2)

## 2011-06-22 LAB — CBC WITH DIFFERENTIAL/PLATELET
Basophils Absolute: 0 10*3/uL (ref 0.0–0.1)
Hemoglobin: 13.7 g/dL (ref 12.0–15.0)
Lymphocytes Relative: 19.8 % (ref 12.0–46.0)
Monocytes Relative: 4.9 % (ref 3.0–12.0)
Platelets: 209 10*3/uL (ref 150.0–400.0)
RDW: 14.9 % — ABNORMAL HIGH (ref 11.5–14.6)

## 2011-06-22 LAB — LIPID PANEL
Cholesterol: 186 mg/dL (ref 0–200)
HDL: 62.1 mg/dL (ref >39.00)
Total CHOL/HDL Ratio: 3
Triglycerides: 234 mg/dL — ABNORMAL HIGH (ref 0.0–149.0)
VLDL: 46.8 mg/dL — ABNORMAL HIGH (ref 0.0–40.0)

## 2011-06-22 LAB — BASIC METABOLIC PANEL
Calcium: 9.5 mg/dL (ref 8.4–10.5)
Creatinine, Ser: 0.7 mg/dL (ref 0.4–1.2)

## 2011-06-22 LAB — TSH: TSH: 0.63 u[IU]/mL (ref 0.35–5.50)

## 2011-06-23 ENCOUNTER — Encounter: Payer: Self-pay | Admitting: Pulmonary Disease

## 2011-06-23 ENCOUNTER — Ambulatory Visit (INDEPENDENT_AMBULATORY_CARE_PROVIDER_SITE_OTHER): Payer: Medicare Other | Admitting: Pulmonary Disease

## 2011-06-23 VITALS — BP 138/98 | HR 88 | Temp 97.0°F | Ht 63.0 in | Wt 165.6 lb

## 2011-06-23 DIAGNOSIS — J449 Chronic obstructive pulmonary disease, unspecified: Secondary | ICD-10-CM

## 2011-06-23 DIAGNOSIS — K589 Irritable bowel syndrome without diarrhea: Secondary | ICD-10-CM

## 2011-06-23 DIAGNOSIS — IMO0001 Reserved for inherently not codable concepts without codable children: Secondary | ICD-10-CM

## 2011-06-23 DIAGNOSIS — M1990 Unspecified osteoarthritis, unspecified site: Secondary | ICD-10-CM

## 2011-06-23 DIAGNOSIS — J4489 Other specified chronic obstructive pulmonary disease: Secondary | ICD-10-CM

## 2011-06-23 DIAGNOSIS — F411 Generalized anxiety disorder: Secondary | ICD-10-CM

## 2011-06-23 DIAGNOSIS — I10 Essential (primary) hypertension: Secondary | ICD-10-CM

## 2011-06-23 DIAGNOSIS — M5030 Other cervical disc degeneration, unspecified cervical region: Secondary | ICD-10-CM

## 2011-06-23 DIAGNOSIS — I2789 Other specified pulmonary heart diseases: Secondary | ICD-10-CM

## 2011-06-23 DIAGNOSIS — E7800 Pure hypercholesterolemia, unspecified: Secondary | ICD-10-CM

## 2011-06-23 DIAGNOSIS — G894 Chronic pain syndrome: Secondary | ICD-10-CM

## 2011-06-23 DIAGNOSIS — K219 Gastro-esophageal reflux disease without esophagitis: Secondary | ICD-10-CM

## 2011-06-23 MED ORDER — OMEPRAZOLE 20 MG PO CPDR: 20.0000 mg | DELAYED_RELEASE_CAPSULE | Freq: Two times a day (BID) | ORAL | Status: DC

## 2011-06-23 MED ORDER — EZETIMIBE-SIMVASTATIN 10-80 MG PO TABS: 1.0000 | ORAL_TABLET | Freq: Every day | ORAL | Status: DC

## 2011-06-23 MED ORDER — ALBUTEROL SULFATE HFA 108 (90 BASE) MCG/ACT IN AERS: 2.0000 | INHALATION_SPRAY | RESPIRATORY_TRACT | Status: DC | PRN

## 2011-06-23 MED ORDER — HYDROCODONE-ACETAMINOPHEN 10-650 MG PO TABS: 1.0000 | ORAL_TABLET | Freq: Four times a day (QID) | ORAL | Status: DC | PRN

## 2011-06-23 MED ORDER — FLUTICASONE-SALMETEROL 250-50 MCG/DOSE IN AEPB: 1.0000 | INHALATION_SPRAY | Freq: Two times a day (BID) | RESPIRATORY_TRACT | Status: DC

## 2011-06-23 NOTE — Patient Instructions (Signed)
Today we updated your med list in our EPIC system...    Continue your current medications the same...    We refilled your meds per your request...  We reviewed your recent labs & gave you a copy for your records...  DIET Rx>> low carb, low fat, get your weight down!!!  For nocturnal leg cramps>> try a glass of Tonic Water at bedtime, or a tsp of yellow mustard...  Call for any questions...  Let's plan a follow up visit in 4-49mo.Marland KitchenMarland Kitchen
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
## 2011-06-24 ENCOUNTER — Encounter: Payer: Self-pay | Admitting: Pulmonary Disease

## 2011-07-19 ENCOUNTER — Telehealth: Payer: Self-pay | Admitting: Pulmonary Disease

## 2011-07-19 ENCOUNTER — Other Ambulatory Visit: Payer: Self-pay | Admitting: Pulmonary Disease

## 2011-07-19 NOTE — Telephone Encounter (Signed)
Called and spoke with pt and she stated that she is not able to find her rx that were given to her at her ov on 4-17.  She is not sure if she [facility] or [facility].  i advised the [facility] to [facility] to see if she did send all of these rx to them.  She will call back if anything further needed.

## 2011-07-20 ENCOUNTER — Telehealth: Payer: Self-pay | Admitting: Pulmonary Disease

## 2011-07-20 NOTE — Telephone Encounter (Signed)
lmomtcb x1 at both #'s listed

## 2011-07-20 NOTE — Telephone Encounter (Signed)
Please advise if ok to refill. Thanks.

## 2011-07-21 NOTE — Telephone Encounter (Signed)
Called both #'s provided above - lmomtcb

## 2011-07-22 ENCOUNTER — Telehealth: Payer: Self-pay | Admitting: Pulmonary Disease

## 2011-07-22 ENCOUNTER — Other Ambulatory Visit: Payer: Self-pay | Admitting: *Deleted

## 2011-07-22 MED ORDER — EZETIMIBE-SIMVASTATIN 10-80 MG PO TABS: 1.0000 | ORAL_TABLET | Freq: Every day | ORAL | Status: DC

## 2011-07-22 NOTE — Telephone Encounter (Signed)
Spoke with pt. She states that she wanted to let Marliss Czar know that she appreciated her help at her recent ov. Also she wanted her to know that she found the rxs she was looking for. Nothing further needed. Will close this and forward to Leigh so she is aware.

## 2011-07-22 NOTE — Telephone Encounter (Signed)
Refill for the vytorin has already been sent to the pharmacy and stated brand name only.

## 2011-07-27 ENCOUNTER — Other Ambulatory Visit: Payer: Self-pay | Admitting: *Deleted

## 2011-07-27 MED ORDER — EZETIMIBE-SIMVASTATIN 10-80 MG PO TABS: 1.0000 | ORAL_TABLET | Freq: Every day | ORAL | Status: DC

## 2011-09-03 ENCOUNTER — Telehealth: Payer: Self-pay | Admitting: Pulmonary Disease

## 2011-09-03 NOTE — Telephone Encounter (Signed)
Pt forgot to request the atenonol Rx as well.  Satira Anis

## 2011-09-03 NOTE — Telephone Encounter (Signed)
I spoke with pt and she states she has been trying to get refill on her alprazolam 1 mg and nopramine 100 mg by Dr. Erling Cruz but can't seem to get them to refill this. She is not sure why they won;t and she keeps calling their office for these refills.  She is getting ready to go to the mtn's and does not want to run out and go through "withdrawls". Pt is wanting to know if SN would refill this for this one time. Please advise Dr. Lenna Gilford, thanks

## 2011-09-03 NOTE — Telephone Encounter (Signed)
Pt states she can now be reached at this number, [phone].[name] [name]

## 2011-09-06 MED ORDER — DESIPRAMINE HCL 100 MG PO TABS: 100.0000 mg | ORAL_TABLET | Freq: Every day | ORAL | Status: DC

## 2011-09-06 MED ORDER — ALPRAZOLAM 1 MG PO TABS: 1.0000 mg | ORAL_TABLET | Freq: Three times a day (TID) | ORAL | Status: AC | PRN

## 2011-09-06 MED ORDER — ATENOLOL 25 MG PO TABS: 25.0000 mg | ORAL_TABLET | Freq: Every day | ORAL | Status: DC

## 2011-09-06 NOTE — Telephone Encounter (Signed)
Per SN---pt will need to get in with Dr. Erling Cruz to have this refilled.  Ok to call in 1 month for these 2 meds for her and no refills.  SN will not be able to give any further refills for those 2 meds.  Pt declined to take the atenolol.  Is she going to start on this med now?  thanks

## 2011-09-06 NOTE — Telephone Encounter (Signed)
Pt aware refill sent on xanax and nopramine one time only. Pt states she has been taking atenolol 67m daily, so refill also sent for this.JRandolph Bing CMA

## 2011-11-01 ENCOUNTER — Telehealth: Payer: Self-pay | Admitting: Emergency Medicine

## 2011-11-01 MED ORDER — FLUTICASONE-SALMETEROL 250-50 MCG/DOSE IN AEPB: 1.0000 | INHALATION_SPRAY | Freq: Two times a day (BID) | RESPIRATORY_TRACT | Status: DC

## 2011-11-01 NOTE — Telephone Encounter (Signed)
Returned patients call, phone lost connection. Finished informing patient that Rx will be signed and mailed to her home.  Nothing further needed at this time.

## 2011-11-01 NOTE — Telephone Encounter (Signed)
Called and spoke with patient. Requesting print RX for Advair 250/50.  Patient states that she wants to start receiving this medication from [facility], as this is more affordable.  Patient requesting rx be mailed to her home, home address verified.  Will have Dr. Lenna Gilford sign and will send Rx to patient.  Nothing further needed at this time.
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
## 2011-11-10 ENCOUNTER — Telehealth: Payer: Self-pay | Admitting: Pulmonary Disease

## 2011-11-10 NOTE — Telephone Encounter (Signed)
Pt is requesting samples of advair 250-50. Sample left at front. Pt is aware.[place] Bing, CMA

## 2011-12-27 ENCOUNTER — Other Ambulatory Visit: Payer: Self-pay | Admitting: Pulmonary Disease

## 2012-02-04 ENCOUNTER — Telehealth: Payer: Self-pay | Admitting: Pulmonary Disease

## 2012-02-04 NOTE — Telephone Encounter (Signed)
lmomtcb

## 2012-02-07 ENCOUNTER — Telehealth: Payer: Self-pay | Admitting: Pulmonary Disease

## 2012-02-07 NOTE — Telephone Encounter (Signed)
Error.[name] [name] ° °

## 2012-02-07 NOTE — Telephone Encounter (Signed)
LMOM TCB x2

## 2012-02-07 NOTE — Telephone Encounter (Signed)
Returning call.[name] [name]

## 2012-02-08 MED ORDER — AZITHROMYCIN 250 MG PO TABS: ORAL_TABLET | ORAL | Status: DC

## 2012-02-08 MED ORDER — FIRST-DUKES MOUTHWASH MT SUSP: OROMUCOSAL | Status: DC

## 2012-02-08 NOTE — Telephone Encounter (Signed)
Pt aware of prescriptions and verbalized understanding.  The pt would like to know if the form for a handicap placard is ready for pick up and she wanted to let SN know that she is still only taking q/w tab on her Atenolol daily and on occasion will take the other half in the evening. Pls advise.

## 2012-02-08 NOTE — Telephone Encounter (Signed)
Pt's spouse called back to follow up on handicap placard.  [name] [name]

## 2012-02-08 NOTE — Telephone Encounter (Signed)
Per SN---ok to call in zpak #1  Take as directed  And call in MMW  #4 oz  1 tsp swish and swallow four times daily as needed.  thanks

## 2012-02-08 NOTE — Telephone Encounter (Signed)
Called and spoke with pt and she is aware of the handicap placards are ready to be picked up.  Pt stated that she will cont to check her BP and has appt with SN in January.  Nothing further is needed.

## 2012-02-08 NOTE — Telephone Encounter (Signed)
Pt's spouse came by to pick up pt's handicap placard.  I advised him this is not ready yet.  He would like a phone call @ [phone] when this is ready to be picked up. Dion Saucier

## 2012-02-08 NOTE — Telephone Encounter (Signed)
Pt returned call.  Reports has a "cold" w/ "a little bit of a sore throat" and a tickle in her throat making her cough.  Reports [facility] MMW and tussionex that she had gotten in the past worked well and would like a new rx for these.  Would also like a new handicap placard x2 (her current ones are expired and she received a $250 ticket yesterday).  Husband will pick up the placards today. [facility] [address]. Last ov w/ SN 4.17.13, follow up in 4 months >> no appt was scheduled.  OV w/ SN scheduled for first available at 1.21.14 @ 3pm.  Pt requests an appt card be included w/ the handicap placards.  Dr Lenna Gilford please advise, thanks.

## 2012-02-18 ENCOUNTER — Other Ambulatory Visit: Payer: Self-pay | Admitting: Pulmonary Disease

## 2012-02-25 ENCOUNTER — Telehealth: Payer: Self-pay | Admitting: Pulmonary Disease

## 2012-02-25 MED ORDER — HYDROCODONE-ACETAMINOPHEN 10-325 MG PO TABS: 1.0000 | ORAL_TABLET | Freq: Four times a day (QID) | ORAL | Status: DC | PRN

## 2012-02-25 NOTE — Telephone Encounter (Signed)
Leigh unable to close message; will attempt to do so.

## 2012-02-25 NOTE — Telephone Encounter (Signed)
(  continued)  Would like to know if SN would like them to adjust the Rx for what they do have available.  [name] [name]

## 2012-02-25 NOTE — Telephone Encounter (Signed)
Called and spoke with carol at the pharmacy and she is aware of change in the hydrocodone for the pt.  Ok to fill the 10/325  #120  1 po every 6 hours as needed and 5 refills given.  Med list has been updated and nothing further is needed.

## 2012-02-28 ENCOUNTER — Telehealth: Payer: Self-pay | Admitting: Pulmonary Disease

## 2012-02-28 NOTE — Telephone Encounter (Signed)
Lmtcbx1.[place] Bing, CMA

## 2012-02-29 NOTE — Telephone Encounter (Signed)
lmomtcb x2

## 2012-03-02 MED ORDER — EZETIMIBE-SIMVASTATIN 10-80 MG PO TABS: 1.0000 | ORAL_TABLET | Freq: Every day | ORAL | Status: DC

## 2012-03-02 MED ORDER — OMEPRAZOLE 20 MG PO CPDR: 20.0000 mg | DELAYED_RELEASE_CAPSULE | Freq: Two times a day (BID) | ORAL | Status: DC

## 2012-03-02 NOTE — Telephone Encounter (Signed)
I have sent her rxs to the pharm I have LMOM for her to be made aware and will close encounter

## 2012-03-02 NOTE — Telephone Encounter (Signed)
Returning call can be reached at [phone].[name] [name]

## 2012-03-16 ENCOUNTER — Telehealth: Payer: Self-pay | Admitting: Pulmonary Disease

## 2012-03-16 MED ORDER — AZITHROMYCIN 250 MG PO TABS: ORAL_TABLET | ORAL | Status: DC

## 2012-03-16 MED ORDER — FIRST-DUKES MOUTHWASH MT SUSP: OROMUCOSAL | Status: DC

## 2012-03-16 NOTE — Telephone Encounter (Signed)
Per SN ok for ZPAK and MMW.  I spoke with pt.a ware rx's have been sent to the pharmacy. Nothing further was needed

## 2012-03-16 NOTE — Telephone Encounter (Signed)
lmomtcb

## 2012-03-16 NOTE — Telephone Encounter (Signed)
I spoke with pt. C/o runny nose, PND, ear ache, sore throat, dry cough x couple days. No f/c/s/n/v. Requesting ZPAK and MMW. Please advised SN thanks Last OV 06/20/11 Pending OV 04/10/12 No Known Allergies

## 2012-03-20 ENCOUNTER — Other Ambulatory Visit: Payer: Self-pay | Admitting: Pulmonary Disease

## 2012-03-20 NOTE — Telephone Encounter (Signed)
lmtcb x1 

## 2012-03-20 NOTE — Telephone Encounter (Signed)
Pt is requesting to increase the omeprazole 20 mg that she is currently taking bid.  Pt states that this dose is not working.  SN please advise. thanks

## 2012-03-21 NOTE — Telephone Encounter (Signed)
LMTCbx1. [place] Bing, CMA

## 2012-03-21 NOTE — Telephone Encounter (Signed)
Per SN---ok to double the omeprazole 40 mg bid   #60.  Last seen 06/2011---has appt with SN on 04/10/12  And she will need to keep this appt.  thanks

## 2012-03-22 NOTE — Telephone Encounter (Signed)
LMTCBx2 on home number. ATC cell number and line was busy x 2. Bradford Bing, CMA

## 2012-03-23 MED ORDER — OMEPRAZOLE 40 MG PO CPDR: 40.0000 mg | DELAYED_RELEASE_CAPSULE | Freq: Two times a day (BID) | ORAL | Status: DC

## 2012-03-23 MED ORDER — ATENOLOL 25 MG PO TABS: 25.0000 mg | ORAL_TABLET | Freq: Every day | ORAL | Status: DC

## 2012-03-23 MED ORDER — EZETIMIBE-SIMVASTATIN 10-80 MG PO TABS: 1.0000 | ORAL_TABLET | Freq: Every day | ORAL | Status: DC

## 2012-03-23 NOTE — Telephone Encounter (Signed)
The pt is aware of the increased dosage for Prilosec and wants this sent to [facility] mail order pharmacy for 90 day supply. She is also requesting 90 fill for the Vytorin and Atenolol. Prescriptions have been sent.

## 2012-03-28 ENCOUNTER — Ambulatory Visit: Payer: Medicare Other | Admitting: Pulmonary Disease

## 2012-03-29 ENCOUNTER — Other Ambulatory Visit: Payer: Self-pay | Admitting: Pulmonary Disease

## 2012-04-10 ENCOUNTER — Ambulatory Visit (INDEPENDENT_AMBULATORY_CARE_PROVIDER_SITE_OTHER): Payer: Medicare Other | Admitting: Pulmonary Disease

## 2012-04-10 ENCOUNTER — Ambulatory Visit (INDEPENDENT_AMBULATORY_CARE_PROVIDER_SITE_OTHER)
Admission: RE | Admit: 2012-04-10 | Discharge: 2012-04-10 | Disposition: A | Discharge status: Home / Self Care | Payer: Medicare Other | Source: Ambulatory Visit | Attending: Pulmonary Disease | Admitting: Pulmonary Disease

## 2012-04-10 ENCOUNTER — Encounter: Payer: Self-pay | Admitting: Pulmonary Disease

## 2012-04-10 VITALS — BP 134/84 | HR 69 | Temp 98.8°F | Ht 63.0 in | Wt 165.0 lb

## 2012-04-10 DIAGNOSIS — R51 Headache: Secondary | ICD-10-CM

## 2012-04-10 DIAGNOSIS — E7800 Pure hypercholesterolemia, unspecified: Secondary | ICD-10-CM

## 2012-04-10 DIAGNOSIS — K589 Irritable bowel syndrome without diarrhea: Secondary | ICD-10-CM

## 2012-04-10 DIAGNOSIS — J449 Chronic obstructive pulmonary disease, unspecified: Secondary | ICD-10-CM

## 2012-04-10 DIAGNOSIS — G894 Chronic pain syndrome: Secondary | ICD-10-CM

## 2012-04-10 DIAGNOSIS — E559 Vitamin D deficiency, unspecified: Secondary | ICD-10-CM

## 2012-04-10 DIAGNOSIS — F411 Generalized anxiety disorder: Secondary | ICD-10-CM

## 2012-04-10 DIAGNOSIS — I10 Essential (primary) hypertension: Secondary | ICD-10-CM

## 2012-04-10 DIAGNOSIS — I2789 Other specified pulmonary heart diseases: Secondary | ICD-10-CM

## 2012-04-10 DIAGNOSIS — IMO0001 Reserved for inherently not codable concepts without codable children: Secondary | ICD-10-CM

## 2012-04-10 DIAGNOSIS — J209 Acute bronchitis, unspecified: Secondary | ICD-10-CM

## 2012-04-10 DIAGNOSIS — M1990 Unspecified osteoarthritis, unspecified site: Secondary | ICD-10-CM

## 2012-04-10 DIAGNOSIS — M5030 Other cervical disc degeneration, unspecified cervical region: Secondary | ICD-10-CM

## 2012-04-10 DIAGNOSIS — K219 Gastro-esophageal reflux disease without esophagitis: Secondary | ICD-10-CM

## 2012-04-10 DIAGNOSIS — J4489 Other specified chronic obstructive pulmonary disease: Secondary | ICD-10-CM

## 2012-04-10 MED ORDER — PREDNISONE 20 MG PO TABS: ORAL_TABLET | ORAL | Status: DC
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
## 2012-04-10 NOTE — Patient Instructions (Addendum)
Today we updated your med list in our EPIC system...    Continue your current medications the same...    We refilled the meds you requested...  For your Asthmatic Bronchitis> continue your current meds...    Be sure you are taking the MUCINEX 630m tabs- 2 tabs twice daily w/ lots of water...    We wrote for a Prednisone 290mtab tapering schedule - follow directions on the bottle...  Today we did a follow up CXR... Please return to our lab one morning this week for your follow up FASTING blood work...    We will contact you w/ the results when avail...  Stay as active as possible & get some exercise every day...  Let's plan a follow up visit in 6 months...Marland KitchenMarland Kitchen

## 2012-05-17 ENCOUNTER — Telehealth: Payer: Self-pay | Admitting: Pulmonary Disease

## 2012-05-17 MED ORDER — DESIPRAMINE HCL 100 MG PO TABS: 100.0000 mg | ORAL_TABLET | Freq: Every day | ORAL | Status: DC

## 2012-05-17 NOTE — Telephone Encounter (Signed)
I spoke with pt and she is wanting to know if SN will refill her desipramine 50 mg 1 tablet BID. She stated she been trying to get this refilled by Dr. Erling Cruz x 3 weeks and can't. Per pt she has been sleeping to much and her depression is taking a toll on her. She stated she tried coming off her anti-depressant thinking she would be fine. Please advise SN thanks Last OV 04/10/12 Pending OV 10/09/12 primemail

## 2012-05-17 NOTE — Telephone Encounter (Signed)
Per SN---  Ok to refill the norpramin 50 mg   2 at bedtime #60 with 1 refill. If this is working ok for her then she can stay on this dose but if this needs to be adjusted then she will need to go back to see Dr. Erling Cruz .  thanks

## 2012-05-17 NOTE — Telephone Encounter (Signed)
Pt is aware of SN recs. Rx has been sent in.

## 2012-05-19 ENCOUNTER — Telehealth: Payer: Self-pay | Admitting: Pulmonary Disease

## 2012-05-19 MED ORDER — AZITHROMYCIN 250 MG PO TABS: 250.0000 mg | ORAL_TABLET | Freq: Every day | ORAL | Status: DC

## 2012-05-19 NOTE — Telephone Encounter (Signed)
Per SN-okay to give Zpak #1 take as directed no refills.Pt's spouse aware that Rx has been sent to [facility] Dr.

## 2012-05-19 NOTE — Telephone Encounter (Signed)
Called, spoke with pt.  Reports she still has chest congestion and "can't get the last bit out."  Is coughing to get the mucus up.  Mucus is greenish yellow and heavy.  Does have some hoarseness.  Reports she is feeling better.  Denies fever, SOB, wheezing, chest tightness, or chest pain.  Is taking aleve 1-2 times daily and mucinex bid.  Requesting zpak.  Dr. Lenna Gilford, pls advise if this is ok.  Thank you.  Pt would like SN to know she is still not smoking and does intend to schedule an appt with RB soon.   Last OV with SN 04/10/12  [facility]  [address]  nkda- verified with pt  ** Pt requesting we leave detailed msg on # provided above if she doesn't answer.  She will call back and leave new msg with any additional questions she may have.

## 2012-06-07 ENCOUNTER — Telehealth: Payer: Self-pay | Admitting: Pulmonary Disease

## 2012-06-07 NOTE — Telephone Encounter (Signed)
lmomtcb x1 

## 2012-06-08 NOTE — Telephone Encounter (Signed)
Error

## 2012-06-08 NOTE — Telephone Encounter (Signed)
LMTCB on her home phone number Tried calling her cell phone number and it's a correct number.

## 2012-06-09 ENCOUNTER — Telehealth: Payer: Self-pay | Admitting: Pulmonary Disease

## 2012-06-09 NOTE — Telephone Encounter (Signed)
LMTCB on home number ATC mobile number phone rang several times and then connection interrupted

## 2012-06-09 NOTE — Telephone Encounter (Signed)
Pt last seen by SN in 04/2012 and was told to return to our lab for her fasting labs.  Called and lmom to make the pt aware that she will need to return tot he lab one day next week for these fasting labs. Nothing further is needed.

## 2012-06-12 NOTE — Telephone Encounter (Signed)
[id number].  Bing, CMA

## 2012-06-13 NOTE — Telephone Encounter (Signed)
LMOM TCB x4 - informed pt to call back if anything further is needed.  Will sign per triage protocol.

## 2012-06-25 ENCOUNTER — Emergency Department (HOSPITAL_COMMUNITY): Payer: Medicare Other

## 2012-06-25 ENCOUNTER — Telehealth: Payer: Self-pay | Admitting: Internal Medicine

## 2012-06-25 ENCOUNTER — Inpatient Hospital Stay (HOSPITAL_COMMUNITY)
Admission: EM | Admit: 2012-06-25 | Discharge: 2012-06-28 | DRG: 189 | Disposition: A | Discharge status: Home / Self Care | Payer: Medicare Other | Attending: Internal Medicine | Admitting: Internal Medicine

## 2012-06-25 ENCOUNTER — Encounter (HOSPITAL_COMMUNITY): Payer: Self-pay | Admitting: *Deleted

## 2012-06-25 DIAGNOSIS — E7800 Pure hypercholesterolemia, unspecified: Secondary | ICD-10-CM

## 2012-06-25 DIAGNOSIS — R269 Unspecified abnormalities of gait and mobility: Secondary | ICD-10-CM | POA: Diagnosis present

## 2012-06-25 DIAGNOSIS — J4489 Other specified chronic obstructive pulmonary disease: Secondary | ICD-10-CM

## 2012-06-25 DIAGNOSIS — R51 Headache: Secondary | ICD-10-CM

## 2012-06-25 DIAGNOSIS — F29 Unspecified psychosis not due to a substance or known physiological condition: Secondary | ICD-10-CM | POA: Diagnosis present

## 2012-06-25 DIAGNOSIS — M1990 Unspecified osteoarthritis, unspecified site: Secondary | ICD-10-CM

## 2012-06-25 DIAGNOSIS — R270 Ataxia, unspecified: Secondary | ICD-10-CM

## 2012-06-25 DIAGNOSIS — G894 Chronic pain syndrome: Secondary | ICD-10-CM

## 2012-06-25 DIAGNOSIS — R404 Transient alteration of awareness: Secondary | ICD-10-CM | POA: Diagnosis present

## 2012-06-25 DIAGNOSIS — J209 Acute bronchitis, unspecified: Secondary | ICD-10-CM

## 2012-06-25 DIAGNOSIS — E871 Hypo-osmolality and hyponatremia: Secondary | ICD-10-CM

## 2012-06-25 DIAGNOSIS — I10 Essential (primary) hypertension: Secondary | ICD-10-CM

## 2012-06-25 DIAGNOSIS — R011 Cardiac murmur, unspecified: Secondary | ICD-10-CM

## 2012-06-25 DIAGNOSIS — I421 Obstructive hypertrophic cardiomyopathy: Secondary | ICD-10-CM

## 2012-06-25 DIAGNOSIS — F411 Generalized anxiety disorder: Secondary | ICD-10-CM

## 2012-06-25 DIAGNOSIS — R4182 Altered mental status, unspecified: Secondary | ICD-10-CM

## 2012-06-25 DIAGNOSIS — D72829 Elevated white blood cell count, unspecified: Secondary | ICD-10-CM

## 2012-06-25 DIAGNOSIS — G253 Myoclonus: Secondary | ICD-10-CM

## 2012-06-25 DIAGNOSIS — K219 Gastro-esophageal reflux disease without esophagitis: Secondary | ICD-10-CM

## 2012-06-25 DIAGNOSIS — F0390 Unspecified dementia without behavioral disturbance: Secondary | ICD-10-CM | POA: Diagnosis present

## 2012-06-25 DIAGNOSIS — IMO0001 Reserved for inherently not codable concepts without codable children: Secondary | ICD-10-CM

## 2012-06-25 DIAGNOSIS — K589 Irritable bowel syndrome without diarrhea: Secondary | ICD-10-CM

## 2012-06-25 DIAGNOSIS — E559 Vitamin D deficiency, unspecified: Secondary | ICD-10-CM

## 2012-06-25 DIAGNOSIS — F3289 Other specified depressive episodes: Secondary | ICD-10-CM | POA: Diagnosis present

## 2012-06-25 DIAGNOSIS — J449 Chronic obstructive pulmonary disease, unspecified: Secondary | ICD-10-CM

## 2012-06-25 DIAGNOSIS — I2789 Other specified pulmonary heart diseases: Secondary | ICD-10-CM

## 2012-06-25 DIAGNOSIS — R2681 Unsteadiness on feet: Secondary | ICD-10-CM

## 2012-06-25 DIAGNOSIS — M5030 Other cervical disc degeneration, unspecified cervical region: Secondary | ICD-10-CM

## 2012-06-25 DIAGNOSIS — E860 Dehydration: Secondary | ICD-10-CM

## 2012-06-25 DIAGNOSIS — E876 Hypokalemia: Secondary | ICD-10-CM

## 2012-06-25 DIAGNOSIS — F329 Major depressive disorder, single episode, unspecified: Secondary | ICD-10-CM | POA: Diagnosis present

## 2012-06-25 DIAGNOSIS — J9620 Acute and chronic respiratory failure, unspecified whether with hypoxia or hypercapnia: Principal | ICD-10-CM

## 2012-06-25 DIAGNOSIS — J440 Chronic obstructive pulmonary disease with acute lower respiratory infection: Secondary | ICD-10-CM | POA: Diagnosis present

## 2012-06-25 LAB — CBC WITH DIFFERENTIAL/PLATELET
Basophils Absolute: 0 10*3/uL (ref 0.0–0.1)
Basophils Relative: 0 % (ref 0–1)
HCT: 50.2 % — ABNORMAL HIGH (ref 36.0–46.0)
MCHC: 34.1 g/dL (ref 30.0–36.0)
Monocytes Absolute: 1.9 10*3/uL — ABNORMAL HIGH (ref 0.1–1.0)
Neutro Abs: 17.8 10*3/uL — ABNORMAL HIGH (ref 1.7–7.7)
Neutrophils Relative %: 79 % — ABNORMAL HIGH (ref 43–77)
Platelets: 342 10*3/uL (ref 150–400)
RDW: 13.5 % (ref 11.5–15.5)

## 2012-06-25 LAB — URINALYSIS, ROUTINE W REFLEX MICROSCOPIC
Glucose, UA: NEGATIVE mg/dL
Hgb urine dipstick: NEGATIVE
Specific Gravity, Urine: 1.031 — ABNORMAL HIGH (ref 1.005–1.030)

## 2012-06-25 LAB — URINE MICROSCOPIC-ADD ON

## 2012-06-25 LAB — COMPREHENSIVE METABOLIC PANEL
ALT: 19 U/L (ref 0–35)
Alkaline Phosphatase: 78 U/L (ref 39–117)
CO2: 26 mEq/L (ref 19–32)
GFR calc Af Amer: >90 mL/min (ref >90)
GFR calc non Af Amer: >90 mL/min (ref >90)
Glucose, Bld: 130 mg/dL — ABNORMAL HIGH (ref 70–99)
Potassium: 3.2 mEq/L — ABNORMAL LOW (ref 3.5–5.1)
Sodium: 131 mEq/L — ABNORMAL LOW (ref 135–145)
Total Bilirubin: 0.7 mg/dL (ref 0.3–1.2)

## 2012-06-25 LAB — RAPID URINE DRUG SCREEN, HOSP PERFORMED
Opiates: NOT DETECTED
Tetrahydrocannabinol: NOT DETECTED

## 2012-06-25 MED ORDER — SODIUM CHLORIDE 0.9 % IV BOLUS (SEPSIS)
1000.0000 mL | Freq: Once | INTRAVENOUS | Status: AC
  Administered 2012-06-25: 1000 mL via INTRAVENOUS

## 2012-06-25 MED ORDER — LORAZEPAM 2 MG/ML IJ SOLN
1.0000 mg | Freq: Once | INTRAMUSCULAR | Status: AC
  Administered 2012-06-25: 1 mg via INTRAVENOUS
  Filled 2012-06-25: qty 1

## 2012-06-25 NOTE — ED Notes (Addendum)
Ambulated to hallway. Patient a little unsteady at times. Katie, PA aware.

## 2012-06-25 NOTE — ED Notes (Signed)
Pt coming from home with c/o "jerking" and memory and speech problems. Pt sitting in chair with intermittent full body muscle spasms. Pt's boyfriend states pt has difficulty thinking of what she wants to say sometimes. Denies slurred speech. Symptoms started a couple of days ago. Pt is A&Ox4, skin warm and dry respirations equal and unlabored, grips equal and strong in all extremities, no facial droop, no loss of sensation, no slurred speech, and no difficulty swallowing. Pt reports weakness

## 2012-06-25 NOTE — Telephone Encounter (Signed)
Patient with  multiple medical problems. Husband says that past 2 weeks she has not a car due to confusion. Past 1 or 2 days he's been having constant jerks. He that he calls into the answering service. I asked him several questions and he relates these questions to her and she answers mostly the negative for fever cough sputum but he states that she continues to have jerks. She has not taken opioids in 2 days . Also it appears urine output might be diminished  Differential diagnosis includes dehydration with opioid induced neurotoxicity, opioid withdrawal, and other miscellaneous   Advised to go to her emergency department .  He will drive her to [facility] long emergency department and I have informed the emergency department physician there Dr Sander Radon    Dr. Brand Males, M.D., [facility].C.P Pulmonary and Critical Care Medicine Staff Physician [facility] Pager: [phone], If no answer or between  15:00h - 7:00h: call [phone]  06/25/2012 7:25 PM

## 2012-06-25 NOTE — ED Notes (Signed)
Patient transported to CT 

## 2012-06-25 NOTE — ED Provider Notes (Signed)
History     CSN: [id number]  Arrival date & time 06/25/12  2001   First MD Initiated Contact with Patient 06/25/12 2101      Chief Complaint  Patient presents with  . Weakness    (Consider location/radiation/quality/duration/timing/severity/associated sxs/prior treatment) HPI History provided by pt and her significant other.  Level 5 caveat applies d/t confusion.  Pt reports diffuse myoclonic jerking x 2-3 days.  No prior history.  Has also had generalized weakness, intermittent slurred speech, confusion, and feels off balance while walking.  Her significant other has been out of town but noticed her to have the jerking as well as confusion when he returned home today.  Describes confusion as memory impairment, tangential speech and expressive aphasia.  Pt denies headache, dizziness, vision changes, dysphagia, paresthesias.  No recent head trauma.  No recent illnesses including fever, CP, SOB, worse than baseline cough, diarrhea, urinary sx.  Had single episode of vomiting yesterday.  She administers her own medications and is unsure of what dosages she is on or when she last took her norco for chronic pain.  Dr. Wyvonnia Dusky spoke w/ Dr. Chase Caller and he is suspicious that patient could have opioid neurotoxicity or opioid withdrawal.  Past Medical History  Diagnosis Date  . COPD (chronic obstructive pulmonary disease)   . Hypertension   . Hypercholesteremia   . GERD (gastroesophageal reflux disease)   . IBS (irritable bowel syndrome)   . DJD (degenerative joint disease)   . Cervical disc disease   . Fibromyalgia   . Vitamin D deficiency   . Chronic pain syndrome   . Chronic headache   . Anxiety disorder     Past Surgical History  Procedure Laterality Date  . Vesicovaginal fistula closure w/ tah  2001  . Anterior cervical decomp/discectomy fusion  2002  . Carpal tunnel release      bilateral    Family History  Problem Relation Age of Onset  . Heart disease Mother   . Other  Father     asbestos exposure    History  Substance Use Topics  . Smoking status: Former Smoker -- 1.00 packs/day    Types: Cigarettes    Quit date: 07/11/2009  . Smokeless tobacco: Former Systems developer    Quit date: 07/11/2009  . Alcohol Use: No    OB History   Grav Para Term Preterm Abortions TAB SAB Ect Mult Living                  Review of Systems  All other systems reviewed and are negative.    Allergies  Review of patient's allergies indicates no known allergies.  Home Medications   Current Outpatient Rx  Name  Route  Sig  Dispense  Refill  . albuterol (PROAIR HFA) 108 (90 BASE) MCG/ACT inhaler   Inhalation   Inhale 2 puffs into the lungs every 4 (four) hours as needed for wheezing.   3 each   3   . ALPRAZolam (XANAX) 1 MG tablet   Oral   Take 1 tablet (1 mg total) by mouth 3 (three) times daily as needed. As directed by Dr. Erling Cruz   90 tablet   0   . atenolol (TENORMIN) 25 MG tablet   Oral   Take 1 tablet (25 mg total) by mouth daily.   90 tablet   0   . chlorpheniramine-HYDROcodone (TUSSIONEX PENNKINETIC ER) 10-8 MG/5ML LQCR      1 tsp by mouth every 12 hours as needed for  cough   120 mL   0   . desipramine (NOPRAMIN) 100 MG tablet   Oral   Take 1 tablet (100 mg total) by mouth at bedtime. Per Dr. Erling Cruz   60 tablet   1   . Diphenhyd-Hydrocort-Nystatin (FIRST-DUKES MOUTHWASH) SUSP      1 tsp swish and swallow four times daily as needed   120 mL   0   . ezetimibe-simvastatin (VYTORIN) 10-80 MG per tablet   Oral   Take 1 tablet by mouth at bedtime.   90 tablet   0     Dispense as written.   . Fluticasone-Salmeterol (ADVAIR DISKUS) 250-50 MCG/DOSE AEPB   Inhalation   Inhale 1 puff into the lungs every 12 (twelve) hours.   180 each   3   . HYDROcodone-acetaminophen (NORCO) 10-325 MG per tablet   Oral   Take 1 tablet by mouth every 6 (six) hours as needed for pain.   120 tablet   5   . omeprazole (PRILOSEC) 40 MG capsule   Oral   Take 1  capsule (40 mg total) by mouth 2 (two) times daily.   180 capsule   0   . tretinoin (RETIN-A) 0.025 % cream   Topical   Apply 1 application topically at bedtime. Or as directed           BP 114/62  Pulse 67  Temp(Src) 98.3 F (36.8 C) (Oral)  Resp 24  Ht _0  (1.6 m)  Wt 160 lb (72.576 kg)  BMI 28.35 kg/m2  SpO2 98%  Physical Exam  Nursing note and vitals reviewed. Constitutional: She is oriented to person, place, and time. She appears well-developed and well-nourished. No distress.  HENT:  Head: Normocephalic and atraumatic.  Mouth/Throat: Oropharynx is clear and moist.  Eyes:  Normal appearance  Neck: Normal range of motion.  Cardiovascular: Normal rate, regular rhythm and intact distal pulses.   Pulmonary/Chest: Effort normal.  Diffuse expiratory rhonchi   Abdominal: Soft. Bowel sounds are normal. She exhibits no distension. There is no tenderness.  Musculoskeletal: Normal range of motion.  Neurological: She is alert and oriented to person, place, and time. No sensory deficit. Coordination normal.  Intermittent myoclonic jerking in all four extremities.  Expressive aphagia and memory impairment.  For example, has a difficult time describing course of her sx  As well recalling which medications she is on or whether or not she has been compliant with them.  Defers several of my questions to her significant other.  CN 3-12 intact.  No nystagmus. 5/5 and equal upper and lower extremity strength.  No past pointing.     Skin: Skin is warm and dry. No rash noted.  Psychiatric: She has a normal mood and affect. Her behavior is normal.    ED Course  Procedures (including critical care time)  Labs Reviewed  URINALYSIS, ROUTINE W REFLEX MICROSCOPIC - Abnormal; Notable for the following:    Specific Gravity, Urine 1.031 (*)    Bilirubin Urine MODERATE (*)    Protein, ur 100 (*)    All other components within normal limits  COMPREHENSIVE METABOLIC PANEL - Abnormal; Notable  for the following:    Sodium 131 (*)    Potassium 3.2 (*)    Chloride 95 (*)    Glucose, Bld 130 (*)    AST 38 (*)    All other components within normal limits  CBC WITH DIFFERENTIAL - Abnormal; Notable for the following:    WBC 22.7 (*)  RBC 5.57 (*)    Hemoglobin 17.1 (*)    HCT 50.2 (*)    Neutrophils Relative 79 (*)    Neutro Abs 17.8 (*)    Monocytes Absolute 1.9 (*)    All other components within normal limits  SALICYLATE LEVEL - Abnormal; Notable for the following:    Salicylate Lvl <4.1 (*)    All other components within normal limits  URINE MICROSCOPIC-ADD ON - Abnormal; Notable for the following:    Squamous Epithelial / LPF FEW (*)    All other components within normal limits  ACETAMINOPHEN LEVEL  URINE RAPID DRUG SCREEN (HOSP PERFORMED)   Dg Chest 2 View  06/25/2012  *RADIOLOGY REPORT*  Clinical Data: Altered mental status.  Cough and leukocytosis.  CHEST - 2 VIEW  Comparison: 04/10/2012  Findings: Mild hyperinflation. Midline trachea.  Normal heart size and mediastinal contours for age.  No pleural effusion or pneumothorax.  Mild lower lobe predominant interstitial thickening. Mild scarring at the left lung base.  IMPRESSION: No acute cardiopulmonary disease.  COPD/chronic bronchitis.   Original Report Authenticated By: Abigail Miyamoto, M.D.    Ct Head Wo Contrast  06/25/2012  *RADIOLOGY REPORT*  Clinical Data: Altered mental status, slurred speech  CT HEAD WITHOUT CONTRAST  Technique:  Contiguous axial images were obtained from the base of the skull through the vertex without contrast.  Comparison: None.  Findings: There is no evidence for acute hemorrhage, hydrocephalus, mass lesion, or abnormal extra-axial fluid collection.  No definite CT evidence for acute infarction.  Opacified right greater left ethmoid air cells are and left sphenoid chamber.  Maxillary sinuses not imaged.  Mastoid air cells clear.  IMPRESSION: No acute intracranial abnormality.  Partially opacified  paranasal sinuses may reflect acute sinusitis.   Original Report Authenticated By: Carlos Levering, M.D.      1. Dehydration   2. Leukocytosis   3. Ataxia   4. Myoclonic jerking       MDM  62yo F presents w/ AMS, intermittent slurred speech, generalized weakness and myoclonic jerking.  Onset unclear but likely over the last 2 days.   No recent illnesses or head trauma.   Pt administers own medications and unsure of whether or not she has been compliant. Referred by PCP for possible opioid withdrawal or opioid neurotoxicity (takes norco for chronic pain).  Exam sig for intermittent jerking of extremities, memory impairment and expressive aphagia.  Labs consistent w/ dehydration and the WBC count is significantly elevated.  No UTI.  Has chronic cough but sig other says it is now productive of green sputum; CXR to r/o CAP as source of leukocytosis pending.  CT head neg.  Pt received IV fluids as well as 67m IV ativan for jerking.    CXR neg.  All results discussed w/ patient and her family.  Her significant other clarifies that she is not acutely altered.  Has chronic memory impairment and confusion that had gradually progressed over the past month.  New today is jerking and unsteadiness on her feet.  Pt ambulated and is unsteady here in ED.  [facility] consulted for admission for MRI to r/o posterior circulation stroke as well as further treatment of dehydration.          CRemer Macho PA-C 06/26/12 [id number]

## 2012-06-25 NOTE — ED Notes (Signed)
Pt reports supposed to be on 2.5L of O2 at home.  Pt placed on 2L [place].

## 2012-06-26 ENCOUNTER — Inpatient Hospital Stay (HOSPITAL_COMMUNITY): Payer: Medicare Other

## 2012-06-26 ENCOUNTER — Telehealth: Payer: Self-pay | Admitting: Pulmonary Disease

## 2012-06-26 DIAGNOSIS — R2681 Unsteadiness on feet: Secondary | ICD-10-CM | POA: Diagnosis present

## 2012-06-26 DIAGNOSIS — E860 Dehydration: Secondary | ICD-10-CM

## 2012-06-26 DIAGNOSIS — R279 Unspecified lack of coordination: Secondary | ICD-10-CM

## 2012-06-26 DIAGNOSIS — G253 Myoclonus: Secondary | ICD-10-CM | POA: Insufficient documentation

## 2012-06-26 LAB — CBC
MCHC: 32.5 g/dL (ref 30.0–36.0)
Platelets: 265 10*3/uL (ref 150–400)
RDW: 13.7 % (ref 11.5–15.5)
WBC: 12.1 10*3/uL — ABNORMAL HIGH (ref 4.0–10.5)

## 2012-06-26 LAB — HEMOGLOBIN A1C: Mean Plasma Glucose: 128 mg/dL — ABNORMAL HIGH (ref <117)

## 2012-06-26 LAB — TSH: TSH: 1.038 u[IU]/mL (ref 0.350–4.500)

## 2012-06-26 MED ORDER — HYDROCODONE-ACETAMINOPHEN 10-325 MG PO TABS: 1.0000 | ORAL_TABLET | Freq: Four times a day (QID) | ORAL | Status: DC | PRN

## 2012-06-26 MED ORDER — GADOBENATE DIMEGLUMINE 529 MG/ML IV SOLN
15.0000 mL | Freq: Once | INTRAVENOUS | Status: AC | PRN
  Administered 2012-06-26: 15 mL via INTRAVENOUS

## 2012-06-26 MED ORDER — ATENOLOL 25 MG PO TABS
25.0000 mg | ORAL_TABLET | Freq: Every day | ORAL | Status: DC
  Administered 2012-06-26 – 2012-06-28 (×3): 25 mg via ORAL
  Filled 2012-06-26 (×3): qty 1

## 2012-06-26 MED ORDER — AZITHROMYCIN 500 MG PO TABS
500.0000 mg | ORAL_TABLET | ORAL | Status: DC
  Administered 2012-06-26 – 2012-06-28 (×3): 500 mg via ORAL
  Filled 2012-06-26 (×3): qty 1

## 2012-06-26 MED ORDER — ALPRAZOLAM 1 MG PO TABS
1.0000 mg | ORAL_TABLET | Freq: Three times a day (TID) | ORAL | Status: DC | PRN
  Administered 2012-06-26 – 2012-06-28 (×5): 1 mg via ORAL
  Filled 2012-06-26 (×5): qty 1

## 2012-06-26 MED ORDER — POTASSIUM CHLORIDE CRYS ER 20 MEQ PO TBCR
40.0000 meq | EXTENDED_RELEASE_TABLET | Freq: Once | ORAL | Status: AC
  Administered 2012-06-26: 40 meq via ORAL
  Filled 2012-06-26: qty 2

## 2012-06-26 MED ORDER — SODIUM CHLORIDE 0.9 % IV SOLN: INTRAVENOUS | Status: AC

## 2012-06-26 MED ORDER — ASPIRIN EC 81 MG PO TBEC
81.0000 mg | DELAYED_RELEASE_TABLET | Freq: Every day | ORAL | Status: DC
  Administered 2012-06-26 – 2012-06-28 (×3): 81 mg via ORAL
  Filled 2012-06-26 (×3): qty 1

## 2012-06-26 MED ORDER — ENOXAPARIN SODIUM 40 MG/0.4ML ~~LOC~~ SOLN
40.0000 mg | SUBCUTANEOUS | Status: DC
  Administered 2012-06-26 – 2012-06-28 (×3): 40 mg via SUBCUTANEOUS
  Filled 2012-06-26 (×3): qty 0.4

## 2012-06-26 MED ORDER — CEFTRIAXONE SODIUM 1 G IJ SOLR
1.0000 g | INTRAMUSCULAR | Status: DC
  Administered 2012-06-26 – 2012-06-28 (×3): 1 g via INTRAVENOUS
  Filled 2012-06-26 (×3): qty 10

## 2012-06-26 MED ORDER — SODIUM CHLORIDE 0.9 % IV SOLN
INTRAVENOUS | Status: DC
  Administered 2012-06-26: 03:00:00 via INTRAVENOUS

## 2012-06-26 MED ORDER — DESIPRAMINE HCL 50 MG PO TABS
100.0000 mg | ORAL_TABLET | Freq: Every day | ORAL | Status: DC
  Filled 2012-06-26: qty 2

## 2012-06-26 MED ORDER — EZETIMIBE-SIMVASTATIN 10-80 MG PO TABS
1.0000 | ORAL_TABLET | Freq: Every day | ORAL | Status: DC
  Administered 2012-06-26 – 2012-06-27 (×2): 1 via ORAL
  Filled 2012-06-26 (×3): qty 1

## 2012-06-26 MED ORDER — PANTOPRAZOLE SODIUM 40 MG PO TBEC
40.0000 mg | DELAYED_RELEASE_TABLET | Freq: Every day | ORAL | Status: DC
  Administered 2012-06-26 – 2012-06-28 (×3): 40 mg via ORAL
  Filled 2012-06-26 (×3): qty 1

## 2012-06-26 MED ORDER — MOMETASONE FURO-FORMOTEROL FUM 100-5 MCG/ACT IN AERO
2.0000 | INHALATION_SPRAY | Freq: Two times a day (BID) | RESPIRATORY_TRACT | Status: DC
  Administered 2012-06-26 – 2012-06-28 (×5): 2 via RESPIRATORY_TRACT
  Filled 2012-06-26: qty 8.8

## 2012-06-26 MED ORDER — SODIUM CHLORIDE 0.9 % IJ SOLN
3.0000 mL | Freq: Two times a day (BID) | INTRAMUSCULAR | Status: DC
  Administered 2012-06-26 – 2012-06-28 (×6): 3 mL via INTRAVENOUS

## 2012-06-26 NOTE — ED Notes (Signed)
Dr. Cathren Laine at bedside.

## 2012-06-26 NOTE — ED Notes (Signed)
Right arm jerking noted on pt.

## 2012-06-26 NOTE — Progress Notes (Signed)
Subjective: Patient much improved from last night per husbadnd, but still with some confusion.   Her husband left for a trip Wednesday 04/16 and returned last ngith 04/20. She was baseline and normal on 04/16 and confused last ngith. Time frame for development of this is not clear. She also was noted to have some myoclonic jerks  Of note, she has been having progressive difficulty with her memory for the past few years.   She reports a bad cough for the past few days.   Exam: Filed Vitals:   06/26/12 0545  BP: 132/68  Pulse: 62  Temp: 99 F (37.2 C)  Resp: 22   Gen: In bed, NAD MS: Awake, Alert, oriented to year, but consistently looks to husband for help. Unable to give number of quarters in $2.75(answered 6) or spell world backwards. She in BW:GYKZL, EOMI, face symmetric Motor: Moves all extremities well. Occasional myoclonus, appeared to be negative myoclonus.  Sensory:intact to LT.   Impression: 62 yo with confusion of unknown duration, but less than 4 days that has improved overnight. Given the problems with memory he has seen, ti is possible she has early dementia or MCI and an infection(likely bronchitis given symptoms) has caused her leukocytosis and a delirium.   With her making improvement, I feel CNS infection much less likel. I do not feel strongly about an LP at this point, but if she were not to continue to improve, then would consider it.   Recommendations: 1) MRI brain 2) EEG 3) Will consider LP if no continued improvement with bronchitis treatment.  4) Will D/C desipramine as anticholinergic effects can exacerbate delirium.   Roland Rack, MD [facility] [phone]  If 7pm- 7am, please page neurology on call at [phone].

## 2012-06-26 NOTE — Progress Notes (Addendum)
Please earlier note by Dr. Cathren Laine. Pt seen and examined at bedside, clinically and hemodynamically stable. Appreciate neurology following. Follow up on MRI and admission blood work. CBC and BMP in AM. Pt congested on exam, will add Zithromax and rocephin as she has history of COPD and bronchitis. Sputum analysis ordered. Check lactic acid, procalcitonin level, urine culture as well.   Faye Ramsay, MD  [facility] Pager [phone]  If 7PM-7AM, please contact night-coverage [email or web address] Password [email or web address]

## 2012-06-26 NOTE — Telephone Encounter (Signed)
i spoke with spouse. He stated pt is currently in the hospital. Not sure when she will be d/c'd. He is wanting to schedule her an appt w/ SN. i advised him to wait until she is d/c'd and then we will go from there. Nothing further was needed

## 2012-06-26 NOTE — Consult Note (Addendum)
NEURO HOSPITALIST CONSULT NOTE    Reason for Consult: Confusion, memory loss ataxia and muscle twitches.  HPI:                                                                                                                                          [name] [name] is an 62 y.o. female of hypertension, hyperlipidemia, COPD, fibromyalgia and chronic anxiety, was brought to the emergency room the husband because of increasing confusion as well as new-onset jerky movements of her extremities and ataxia. Patient has remained alert. She's had one episode of stool incontinence. No previous history of mental status abnormalities. She's been afebrile. Her husband indicated that she had cut back her medication for anxiety. She's had no changes in medication otherwise. CT scan of the head was unremarkable with no signs of acute intracranial abnormality. Laboratory studies were unremarkable with only slight electrolyte changes. WBC count was elevated at 22.7. Significance is unclear.  Past Medical History  Diagnosis Date  . COPD (chronic obstructive pulmonary disease)   . Hypertension   . Hypercholesteremia   . GERD (gastroesophageal reflux disease)   . IBS (irritable bowel syndrome)   . DJD (degenerative joint disease)   . Cervical disc disease   . Fibromyalgia   . Vitamin D deficiency   . Chronic pain syndrome   . Chronic headache   . Anxiety disorder     Past Surgical History  Procedure Laterality Date  . Vesicovaginal fistula closure w/ tah  2001  . Anterior cervical decomp/discectomy fusion  2002  . Carpal tunnel release      bilateral    Family History  Problem Relation Age of Onset  . Heart disease Mother   . Other Father     asbestos exposure     Social History:  reports that she quit smoking about 2 years ago. Her smoking use included Cigarettes. She smoked 1.00 pack per day. She quit smokeless tobacco use about 2 years ago. She reports that she does  not drink alcohol or use illicit drugs.  No Known Allergies  MEDICATIONS:                                                                                                                     Prior  to Admission medications   Medication  Sig  Start Date  End Date  Taking?  Authorizing Provider   albuterol (PROAIR HFA) 108 (90 BASE) MCG/ACT inhaler  Inhale 2 puffs into the lungs every 4 (four) hours as needed for wheezing.  06/23/11   Yes  Noralee Space, MD   ALPRAZolam Duanne Moron) 1 MG tablet  Take 1 tablet (1 mg total) by mouth 3 (three) times daily as needed. As directed by Dr. Erling Cruz  09/06/11  09/05/12  Yes  Noralee Space, MD   atenolol (TENORMIN) 25 MG tablet  Take 1 tablet (25 mg total) by mouth daily.  03/23/12  03/23/13  Yes  Noralee Space, MD   chlorpheniramine-HYDROcodone [facility] PENNKINETIC ER) 10-8 MG/5ML LQCR  1 tsp by mouth every 12 hours as needed for cough  11/25/10   Yes  Noralee Space, MD   desipramine (NOPRAMIN) 100 MG tablet  Take 1 tablet (100 mg total) by mouth at bedtime. Per Dr. Erling Cruz  05/17/12   Yes  Noralee Space, MD   Diphenhyd-Hydrocort-Nystatin (FIRST-DUKES MOUTHWASH) SUSP  1 tsp swish and swallow four times daily as needed  03/16/12   Yes  Noralee Space, MD   ezetimibe-simvastatin (VYTORIN) 10-80 MG per tablet  Take 1 tablet by mouth at bedtime.  03/23/12  03/23/13  Yes  Noralee Space, MD   Fluticasone-Salmeterol (ADVAIR DISKUS) 250-50 MCG/DOSE AEPB  Inhale 1 puff into the lungs every 12 (twelve) hours.  11/01/11  10/31/12  Yes  Noralee Space, MD   HYDROcodone-acetaminophen (NORCO) 10-325 MG per tablet  Take 1 tablet by mouth every 6 (six) hours as needed for pain.  02/25/12   Yes  Noralee Space, MD   omeprazole (PRILOSEC) 40 MG capsule  Take 1 capsule (40 mg total) by mouth 2 (two) times daily.  03/23/12   Yes  Noralee Space, MD   tretinoin (RETIN-A) 0.025 % cream  Apply 1 application topically at bedtime. Or as directed    Yes  Historical Provider, MD      Blood pressure 112/71, pulse  68, temperature 98.3 F (36.8 C), temperature source Oral, resp. rate 23, height _0  (1.6 m), weight 72.576 kg (160 lb), SpO2 98.00%.   Neurologic Examination:                                                                                                      Mental Status: Alert, disoriented to time and place.  Short-term memory was moderately impaired Speech fluent without evidence of aphasia. Able to follow commands for the most part. Cranial Nerves: II-Visual fields were normal. III/IV/VI-Pupils were equal and reacted. Extraocular movements were full and conjugate.    V/VII-no facial numbness and no facial weakness. VIII-normal. X-normal speech and symmetrical palatal movement. Motor: 5/5 bilaterally with normal tone and bulk; frequent choreic-type jerky movements were noted involving upper extremities proximally and distally and to a lesser extent lower extremities. Sensory: Normal throughout. Deep Tendon Reflexes: 2+ and symmetric; no active frontal release signs. Plantars:  Mute  bilaterally  Cerebellar: Normal finger-to-nose testing.   Lab Results  Component Value Date/Time   CHOL 186 06/22/2011 12:11 PM    Results for orders placed during the hospital encounter of 06/25/12 (from the past 48 hour(s))  COMPREHENSIVE METABOLIC PANEL     Status: Abnormal   Collection Time    06/25/12  9:00 PM      Result Value Range   Sodium 131 (*) 135 - 145 mEq/L   Potassium 3.2 (*) 3.5 - 5.1 mEq/L   Chloride 95 (*) 96 - 112 mEq/L   CO2 26  19 - 32 mEq/L   Glucose, Bld 130 (*) 70 - 99 mg/dL   BUN 23  6 - 23 mg/dL   Creatinine, Ser 0.51  0.50 - 1.10 mg/dL   Calcium 10.0  8.4 - 10.5 mg/dL   Total Protein 7.4  6.0 - 8.3 g/dL   Albumin 3.9  3.5 - 5.2 g/dL   AST 38 (*) 0 - 37 U/L   ALT 19  0 - 35 U/L   Alkaline Phosphatase 78  39 - 117 U/L   Total Bilirubin 0.7  0.3 - 1.2 mg/dL   GFR calc non Af Amer >90  >90 mL/min   GFR calc Af Amer >90  >90 mL/min   Comment:            The eGFR  has been calculated     using the CKD EPI equation.     This calculation has not been     validated in all clinical     situations.     eGFR's persistently     <90 mL/min signify     possible Chronic Kidney Disease.  CBC WITH DIFFERENTIAL     Status: Abnormal   Collection Time    06/25/12  9:00 PM      Result Value Range   WBC 22.7 (*) 4.0 - 10.5 K/uL   RBC 5.57 (*) 3.87 - 5.11 MIL/uL   Hemoglobin 17.1 (*) 12.0 - 15.0 g/dL   HCT 50.2 (*) 36.0 - 46.0 %   MCV 90.1  78.0 - 100.0 fL   MCH 30.7  26.0 - 34.0 pg   MCHC 34.1  30.0 - 36.0 g/dL   RDW 13.5  11.5 - 15.5 %   Platelets 342  150 - 400 K/uL   Neutrophils Relative 79 (*) 43 - 77 %   Neutro Abs 17.8 (*) 1.7 - 7.7 K/uL   Lymphocytes Relative 13  12 - 46 %   Lymphs Abs 2.9  0.7 - 4.0 K/uL   Monocytes Relative 8  3 - 12 %   Monocytes Absolute 1.9 (*) 0.1 - 1.0 K/uL   Eosinophils Relative 0  0 - 5 %   Eosinophils Absolute 0.1  0.0 - 0.7 K/uL   Basophils Relative 0  0 - 1 %   Basophils Absolute 0.0  0.0 - 0.1 K/uL  SALICYLATE LEVEL     Status: Abnormal   Collection Time    06/25/12  9:00 PM      Result Value Range   Salicylate Lvl <4.6 (*) 2.8 - 20.0 mg/dL  ACETAMINOPHEN LEVEL     Status: None   Collection Time    06/25/12  9:00 PM      Result Value Range   Acetaminophen (Tylenol), Serum <15.0  10 - 30 ug/mL   Comment:            THERAPEUTIC CONCENTRATIONS VARY     SIGNIFICANTLY. A  RANGE OF 10-30     ug/mL MAY BE AN EFFECTIVE     CONCENTRATION FOR MANY PATIENTS.     HOWEVER, SOME ARE BEST TREATED     AT CONCENTRATIONS OUTSIDE THIS     RANGE.     ACETAMINOPHEN CONCENTRATIONS     >150 ug/mL AT 4 HOURS AFTER     INGESTION AND >50 ug/mL AT 12     HOURS AFTER INGESTION ARE     OFTEN ASSOCIATED WITH TOXIC     REACTIONS.  URINALYSIS, ROUTINE W REFLEX MICROSCOPIC     Status: Abnormal   Collection Time    06/25/12  9:10 PM      Result Value Range   Color, Urine YELLOW  YELLOW   APPearance CLEAR  CLEAR   Specific Gravity,  Urine 1.031 (*) 1.005 - 1.030   pH 6.5  5.0 - 8.0   Glucose, UA NEGATIVE  NEGATIVE mg/dL   Hgb urine dipstick NEGATIVE  NEGATIVE   Bilirubin Urine MODERATE (*) NEGATIVE   Ketones, ur NEGATIVE  NEGATIVE mg/dL   Protein, ur 100 (*) NEGATIVE mg/dL   Urobilinogen, UA 1.0  0.0 - 1.0 mg/dL   Nitrite NEGATIVE  NEGATIVE   Leukocytes, UA NEGATIVE  NEGATIVE  URINE RAPID DRUG SCREEN (HOSP PERFORMED)     Status: None   Collection Time    06/25/12  9:10 PM      Result Value Range   Opiates NONE DETECTED  NONE DETECTED   Cocaine NONE DETECTED  NONE DETECTED   Benzodiazepines NONE DETECTED  NONE DETECTED   Amphetamines NONE DETECTED  NONE DETECTED   Tetrahydrocannabinol NONE DETECTED  NONE DETECTED   Barbiturates NONE DETECTED  NONE DETECTED   Comment:            DRUG SCREEN FOR MEDICAL PURPOSES     ONLY.  IF CONFIRMATION IS NEEDED     FOR ANY PURPOSE, NOTIFY LAB     WITHIN 5 DAYS.                LOWEST DETECTABLE LIMITS     FOR URINE DRUG SCREEN     Drug Class       Cutoff (ng/mL)     Amphetamine      1000     Barbiturate      200     Benzodiazepine   833     Tricyclics       383     Opiates          300     Cocaine          300     THC              50  URINE MICROSCOPIC-ADD ON     Status: Abnormal   Collection Time    06/25/12  9:10 PM      Result Value Range   Squamous Epithelial / LPF FEW (*) RARE   WBC, UA 3-6  <3 WBC/hpf   RBC / HPF 0-2  <3 RBC/hpf   Bacteria, UA RARE  RARE    Dg Chest 2 View  06/25/2012  *RADIOLOGY REPORT*  Clinical Data: Altered mental status.  Cough and leukocytosis.  CHEST - 2 VIEW  Comparison: 04/10/2012  Findings: Mild hyperinflation. Midline trachea.  Normal heart size and mediastinal contours for age.  No pleural effusion or pneumothorax.  Mild lower lobe predominant interstitial thickening. Mild scarring at the left lung base.  IMPRESSION: No  acute cardiopulmonary disease.  COPD/chronic bronchitis.   Original Report Authenticated By: Abigail Miyamoto, M.D.     Ct Head Wo Contrast  06/25/2012  *RADIOLOGY REPORT*  Clinical Data: Altered mental status, slurred speech  CT HEAD WITHOUT CONTRAST  Technique:  Contiguous axial images were obtained from the base of the skull through the vertex without contrast.  Comparison: None.  Findings: There is no evidence for acute hemorrhage, hydrocephalus, mass lesion, or abnormal extra-axial fluid collection.  No definite CT evidence for acute infarction.  Opacified right greater left ethmoid air cells are and left sphenoid chamber.  Maxillary sinuses not imaged.  Mastoid air cells clear.  IMPRESSION: No acute intracranial abnormality.  Partially opacified paranasal sinuses may reflect acute sinusitis.   Original Report Authenticated By: Carlos Levering, M.D.      Assessment/Plan: New-onset mental status changes with cognitive deficits as well as unstable gait and movement disorder. Etiology is unclear. CT showed no signs of acute intracranial abnormality. Laboratory studies showed no signs of significant metabolic abnormality. Significance of elevated WBC count is unclear, as patient has been afebrile.  Recommendations: 1. MRI of the brain without and with contrast 2. Routine adult EEG to assess severity of encephalopathy as well as to rule out possible focal seizure activity. 3. Vitamin B12 and folate levels 4. TSH 5. RPR 6. Urine drug screen   Thank you for asking me to state and this patient's care we will continue to follow her with you.  Rush Farmer M.D. [facility] [phone]  06/26/2012, 2:42 AM

## 2012-06-26 NOTE — H&P (Signed)
History and Physical  [name] [name] VOZ:[id number] DOB: Nov 17, 1950 DOA: 06/25/2012  Referring physician: Dr. Roxanne Mins PCP: No primary provider on file.   Chief Complaint: Unsteady gait  HPI: Patient is a 62 year old female with past medical history significant for COPD on home O2, hypertension, hypercholesterolemia, reflux disease, degenerative disc disease, fibromyalgia and chronic pain syndrome who comes in today with chief complaints of unsteady gait. Patient was apparently well up until 3 days ago when she started having difficulty walking. Difficulty in walking was also associated with jerking movements in her entire trunk. There were no exacerbating or relieving factors. Patient has also noted memory impairment, tangential speech and difficulty finding words since last one week. Pt denies headache, dizziness, vision changes, dysphagia, paresthesias. No recent head trauma. No recent illnesses including fever, CP, SOB, worse than baseline cough, diarrhea, urinary sx. Had single episode of vomiting yesterday. She administers her own medications and is unsure of what dosages she is on or when she last took her norco for chronic pain.   Dr. Wyvonnia Dusky spoke w/ Dr. Chase Caller and he is suspicious that patient could have opioid neurotoxicity or opioid withdrawal. Initial plan was to discharge the patient but since she was unsteady on her feet, it was decided to admit the patient.  Review of Systems:  15 point review of system is negative except what is noted above.  Past Medical History  Diagnosis Date  . COPD (chronic obstructive pulmonary disease)   . Hypertension   . Hypercholesteremia   . GERD (gastroesophageal reflux disease)   . IBS (irritable bowel syndrome)   . DJD (degenerative joint disease)   . Cervical disc disease   . Fibromyalgia   . Vitamin D deficiency   . Chronic pain syndrome   . Chronic headache   . Anxiety disorder     Past Surgical History  Procedure Laterality  Date  . Vesicovaginal fistula closure w/ tah  2001  . Anterior cervical decomp/discectomy fusion  2002  . Carpal tunnel release      bilateral    Social History:  reports that she quit smoking about 2 years ago. Her smoking use included Cigarettes. She smoked 1.00 pack per day. She quit smokeless tobacco use about 2 years ago. She reports that she does not drink alcohol or use illicit drugs.  No Known Allergies  Family History  Problem Relation Age of Onset  . Heart disease Mother   . Other Father     asbestos exposure     Prior to Admission medications   Medication Sig Start Date End Date Taking? Authorizing Provider  albuterol (PROAIR HFA) 108 (90 BASE) MCG/ACT inhaler Inhale 2 puffs into the lungs every 4 (four) hours as needed for wheezing. 06/23/11  Yes Noralee Space, MD  ALPRAZolam Duanne Moron) 1 MG tablet Take 1 tablet (1 mg total) by mouth 3 (three) times daily as needed. As directed by Dr. Erling Cruz 09/06/11 09/05/12 Yes Noralee Space, MD  atenolol (TENORMIN) 25 MG tablet Take 1 tablet (25 mg total) by mouth daily. 03/23/12 03/23/13 Yes Noralee Space, MD  chlorpheniramine-HYDROcodone [facility] PENNKINETIC ER) 10-8 MG/5ML LQCR 1 tsp by mouth every 12 hours as needed for cough 11/25/10  Yes Noralee Space, MD  desipramine (NOPRAMIN) 100 MG tablet Take 1 tablet (100 mg total) by mouth at bedtime. Per Dr. Erling Cruz 05/17/12  Yes Noralee Space, MD  Diphenhyd-Hydrocort-Nystatin (FIRST-DUKES MOUTHWASH) SUSP 1 tsp swish and swallow four times daily as needed 03/16/12  Yes Scott  Roddie Mc, MD  ezetimibe-simvastatin (VYTORIN) 10-80 MG per tablet Take 1 tablet by mouth at bedtime. 03/23/12 03/23/13 Yes Noralee Space, MD  Fluticasone-Salmeterol (ADVAIR DISKUS) 250-50 MCG/DOSE AEPB Inhale 1 puff into the lungs every 12 (twelve) hours. 11/01/11 10/31/12 Yes Noralee Space, MD  HYDROcodone-acetaminophen (NORCO) 10-325 MG per tablet Take 1 tablet by mouth every 6 (six) hours as needed for pain. 02/25/12  Yes Noralee Space, MD   omeprazole (PRILOSEC) 40 MG capsule Take 1 capsule (40 mg total) by mouth 2 (two) times daily. 03/23/12  Yes Noralee Space, MD  tretinoin (RETIN-A) 0.025 % cream Apply 1 application topically at bedtime. Or as directed   Yes Historical Provider, MD   Physical Exam: Filed Vitals:   06/25/12 2009 06/25/12 2211 06/26/12 0000 06/26/12 0033  BP: 121/75   114/62  Pulse: 64  70 67  Temp: 97.9 F (36.6 C) 98.3 F (36.8 C)    TempSrc: Oral     Resp: _0 Height: _1  (1.6 m)     Weight: 160 lb (72.576 kg)     SpO2: 94%  100% 98%  Constitutional: She is oriented to person, place, and time. She appears well-developed and well-nourished. No distress. Patient is having myoclonic jerks while taking history and examination. HENT: Head: Normocephalic and atraumatic. Mouth/Throat: Oropharynx is clear and moist.  Eyes: Normal appearance  Neck: Normal range of motion.  Cardiovascular: Normal rate, regular rhythm and intact distal pulses.  Pulmonary/Chest: Effort normal. Diffuse expiratory rhonchi Abdominal: Soft. Bowel sounds are normal. She exhibits no distension. There is no tenderness.  Musculoskeletal: Normal range of motion.  Neurological: She is alert and oriented to person, place, and time. No sensory deficit. Coordination normal.  Intermittent myoclonic jerking in all four extremities. Expressive aphagia and memory impairment. CN 3-12 intact. No nystagmus. 5/5 and equal upper and lower extremity strength. No past pointing.  Patient was unable to perform heel-to-shin of rapid alternate movements. I did not assess the gait but was told that patient was unable to maneuver herself and was unsteady Skin: Skin is warm and dry. No rash noted.  Psychiatric: She has a normal mood and affect. Her behavior is normal.   Wt Readings from Last 3 Encounters:  06/25/12 160 lb (72.576 kg)  04/10/12 165 lb (74.844 kg)  06/23/11 165 lb 9.6 oz (75.116 kg)    Labs on Admission:  Basic Metabolic  Panel:  Recent Labs Lab 06/25/12 2100  NA 131*  K 3.2*  CL 95*  CO2 26  GLUCOSE 130*  BUN 23  CREATININE 0.51  CALCIUM 10.0    Liver Function Tests:  Recent Labs Lab 06/25/12 2100  AST 38*  ALT 19  ALKPHOS 78  BILITOT 0.7  PROT 7.4  ALBUMIN 3.9   CBC:  Recent Labs Lab 06/25/12 2100  WBC 22.7*  NEUTROABS 17.8*  HGB 17.1*  HCT 50.2*  MCV 90.1  PLT 342    Radiological Exams on Admission: Dg Chest 2 View  06/25/2012  *RADIOLOGY REPORT*  Clinical Data: Altered mental status.  Cough and leukocytosis.  CHEST - 2 VIEW  Comparison: 04/10/2012  Findings: Mild hyperinflation. Midline trachea.  Normal heart size and mediastinal contours for age.  No pleural effusion or pneumothorax.  Mild lower lobe predominant interstitial thickening. Mild scarring at the left lung base.  IMPRESSION: No acute cardiopulmonary disease.  COPD/chronic bronchitis.   Original Report Authenticated By: Abigail Miyamoto, M.D.    Ct Head Wo  Contrast  06/25/2012  *RADIOLOGY REPORT*  Clinical Data: Altered mental status, slurred speech  CT HEAD WITHOUT CONTRAST  Technique:  Contiguous axial images were obtained from the base of the skull through the vertex without contrast.  Comparison: None.  Findings: There is no evidence for acute hemorrhage, hydrocephalus, mass lesion, or abnormal extra-axial fluid collection.  No definite CT evidence for acute infarction.  Opacified right greater left ethmoid air cells are and left sphenoid chamber.  Maxillary sinuses not imaged.  Mastoid air cells clear.  IMPRESSION: No acute intracranial abnormality.  Partially opacified paranasal sinuses may reflect acute sinusitis.   Original Report Authenticated By: Carlos Levering, M.D.      Principal Problem:   Unsteady gait Active Problems:   HYPERCHOLESTEROLEMIA   ANXIETY DISORDER   Chronic pain syndrome   HYPERTENSION   CHRONIC OBSTRUCTIVE ASTHMA UNSPECIFIED   GERD   Irritable bowel syndrome   DEGENERATIVE JOINT  DISEASE   DISC DISEASE, CERVICAL   FIBROMYALGIA   HEADACHE, CHRONIC   Hypokalemia   Hyponatremia   Elevated WBC count   Myoclonic jerking   Assessment/Plan Patient is a 62 year old female with past medical history most significant for COPD on home O2, fibromyalgia, cervical disc disease, degenerative joint disease, irritable bowel syndrome, gastroesophageal reflux disease and hypertension who comes in with chief complaints of myoclonic jerking, worsening memory loss and difficulty walking since last 3-4 days. Above symptoms and physical examination are very concerning for posterior circulation stroke. Other differentials include seizure disorder and infectious disease like CJ disease. -Plan to admit the patient to telemetry -Blood pressure is controlled at this time and I will continue home blood pressure medication -Continue on aspirin -CT head is negative for any acute stroke -MRI head in the morning to rule out posterior circulation stroke -[facility] neurology to assess other causes of jerking along with worsening memory loss -Patient may need a lumbar puncture if MRI is negative to rule out infectious cause -I will continue other medications as per home doses -I will replete potassium and check basic metabolic panel in the morning. -Start on IV fluids normal saline and check sodium -Patient has elevated white cell count without any signs of infection(chest x-rays normal, urinalysis does not show nitrates or leukocytes, CT head is normal). I do not have a clear explanation of elevated WBCs at this time. Patient does not seem to have a personality change, headache, fever suggesting encephalitis or meningitis at this time. -Will check TSH, HBa1c  Code Status: Full code Family Communication: Updated at bedside Disposition Plan/Anticipated LOS: Guarded  Time spent: 38 minutes  Janell Quiet, MD  [facility] Team 5  If 7PM-7AM, please contact night-coverage at [email or web address],  password [facility] 06/26/2012, 1:05 AM

## 2012-06-27 ENCOUNTER — Other Ambulatory Visit (HOSPITAL_COMMUNITY): Payer: Medicare Other

## 2012-06-27 DIAGNOSIS — J9620 Acute and chronic respiratory failure, unspecified whether with hypoxia or hypercapnia: Secondary | ICD-10-CM | POA: Diagnosis present

## 2012-06-27 DIAGNOSIS — J209 Acute bronchitis, unspecified: Secondary | ICD-10-CM

## 2012-06-27 DIAGNOSIS — R4182 Altered mental status, unspecified: Secondary | ICD-10-CM

## 2012-06-27 LAB — BASIC METABOLIC PANEL
BUN: 14 mg/dL (ref 6–23)
Calcium: 9 mg/dL (ref 8.4–10.5)
GFR calc non Af Amer: >90 mL/min (ref >90)
Glucose, Bld: 112 mg/dL — ABNORMAL HIGH (ref 70–99)
Potassium: 3.4 mEq/L — ABNORMAL LOW (ref 3.5–5.1)

## 2012-06-27 LAB — CBC
HCT: 44.1 % (ref 36.0–46.0)
Hemoglobin: 14.1 g/dL (ref 12.0–15.0)
MCH: 30 pg (ref 26.0–34.0)
MCHC: 32 g/dL (ref 30.0–36.0)
MCV: 93.8 fL (ref 78.0–100.0)

## 2012-06-27 LAB — LEGIONELLA ANTIGEN, URINE: Legionella Antigen, Urine: NEGATIVE

## 2012-06-27 LAB — EXPECTORATED SPUTUM ASSESSMENT W GRAM STAIN, RFLX TO RESP C

## 2012-06-27 LAB — PRO B NATRIURETIC PEPTIDE: Pro B Natriuretic peptide (BNP): 1763 pg/mL — ABNORMAL HIGH (ref 0–125)

## 2012-06-27 MED ORDER — POTASSIUM CHLORIDE CRYS ER 20 MEQ PO TBCR
40.0000 meq | EXTENDED_RELEASE_TABLET | Freq: Once | ORAL | Status: AC
  Administered 2012-06-27: 40 meq via ORAL
  Filled 2012-06-27: qty 2

## 2012-06-27 MED ORDER — FUROSEMIDE 10 MG/ML IJ SOLN
20.0000 mg | Freq: Once | INTRAMUSCULAR | Status: AC
  Administered 2012-06-27: 20 mg via INTRAVENOUS
  Filled 2012-06-27: qty 2

## 2012-06-27 NOTE — Progress Notes (Addendum)
Patient ID: [name] [name], female   DOB: 03/31/1950, 62 y.o.   MRN: [id number]  [facility] PROGRESS NOTE  [name] [name]:[id number] DOB: 1950/12/31 DOA: 06/25/2012 PCP: No primary provider on file.  Brief narrative: Pt is 62 year old female with past medical history significant for COPD on home O2, hypertension, hypercholesterolemia, reflux disease, degenerative disc disease, fibromyalgia and chronic pain syndrome who presented to [facility] ED with main concern of unsteady gait that was initially noted 3 days prior to admission, associated with jerking movements in her entire body, with no specific alleviating or aggravating factors. On admission, pt denied headache, dizziness, vision changes, dysphagia, paresthesias. No recent head trauma. No recent illnesses including fever, CP, SOB, diarrhea. She administers her own medications and is unsure of what dosages she is on or when she last took her norco for chronic pain.   Dr. Wyvonnia Dusky spoke w/ Dr. Chase Caller and he was suspicious that patient could have opioid neurotoxicity or opioid withdrawal. Initial plan was to discharge the patient but since she was unsteady on her feet, it was decided to admit the patient by [facility] for further evaluation.   Principal Problem:   Unsteady gait - unclear etiology at this time, appreciate neurology input - will place order for PT evaluation, follow up on neurology recommendations, will ask them to discuss MRI findings and its significance   Active Problems:   Acute and chronic respiratory failure - most likely secondary to COPD, acute bronchitis, ? CHF component  - will continue Zithromax and Rocephin day #2, follow up on sputum analysis - will also given one dose of Lasix 20 mg IV since crackles present on exam, 2 D ECHO pending - monitor oxygen saturations, monitor strict I's and O's, daily weights   HYPERTENSION - reasonable BP control in the hospital   Hypokalemia - supplement with  Kdur, BMP in AM   Hyponatremia - secondary to pre renal etiology, BMP in AM   Elevated WBC count - likely secondary to acute bronchitis, now trending down - continue ABX as noted above   HYPERCHOLESTEROLEMIA - continue statin    Chronic pain syndrome - analgesia as needed   GERD - protonix   Consultants:  Neurology  Procedures/Studies: Dg Chest 2 View 06/25/2012   No acute cardiopulmonary disease.  COPD/chronic bronchitis.   Ct Head Wo Contrast 06/25/2012   No acute intracranial abnormality.  Partially opacified paranasal sinuses may reflect acute sinusitis.    Mr [name] [name] Head Wo Contrast 06/26/2012   1.  Moderate small vessel disease.  This suggests the white matter changes of the brain are likely secondary to chronic microvascular ischemic change.  2.  Significant stenoses with signal loss in the proximal left A1 segment.  3.  No significant proximal stenosis, aneurysm, or branch vessel occlusion.   Mr [name] [name] Contrast 06/26/2012   1.  Scattered periventricular and subcortical T2 foci are present bilaterally.  There is no associated enhancement or diffusion restricted effusion. The finding is nonspecific but can be seen in the setting of chronic microvascular ischemia, a demyelinating process such as multiple sclerosis, vasculitis, complicated migraine headaches, or as the sequelae of a prior infectious or inflammatory process.  2.  Diffuse paranasal sinus disease is greatest in the right maxillary and falling air cells.   Antibiotics:  Zithromax 04/22 -->  Rocephin 04/22 -->  Code Status: Full Family Communication: Pt and husband at bedside Disposition Plan: Home when medically stable  HPI/Subjective: No events overnight.   Objective: Filed  Vitals:   06/26/12 2119 06/26/12 2136 06/27/12 0503 06/27/12 0826  BP: 137/69  156/76   Pulse: 59  67   Temp: 98.3 F (36.8 C)  98.7 F (37.1 C)   TempSrc: Oral  Oral   Resp: _0 Height:      Weight:      SpO2: 97%  100%  99%    Intake/Output Summary (Last 24 hours) at 06/27/12 1340 Last data filed at 06/27/12 1222  Gross per 24 hour  Intake    680 ml  Output   2150 ml  Net  -1470 ml    Exam:   General:  Pt is alert, follows commands appropriately, not in acute distress  Cardiovascular: Regular rate and rhythm, S1/S2, no murmurs, no rubs, no gallops  Respiratory: Clear to auscultation bilaterally, bilateral rhonchi and crackles   Abdomen: Soft, non tender, non distended, bowel sounds present, no guarding  Extremities: No edema, pulses DP and PT palpable bilaterally  Neuro: Grossly nonfocal  Data Reviewed: Basic Metabolic Panel:  Recent Labs Lab 06/25/12 2100 06/27/12 0438  NA 131* 140  K 3.2* 3.4*  CL 95* 104  CO2 26 31  GLUCOSE 130* 112*  BUN 23 14  CREATININE 0.51 0.65  CALCIUM 10.0 9.0   Liver Function Tests:  Recent Labs Lab 06/25/12 2100  AST 38*  ALT 19  ALKPHOS 78  BILITOT 0.7  PROT 7.4  ALBUMIN 3.9    Recent Labs Lab 06/26/12 1152  AMMONIA 20   CBC:  Recent Labs Lab 06/25/12 2100 06/26/12 1152 06/27/12 0438  WBC 22.7* 12.1* 11.7*  NEUTROABS 17.8*  --   --   HGB 17.1* 14.9 14.1  HCT 50.2* 45.9 44.1  MCV 90.1 93.1 93.8  PLT 342 265 219   Recent Results (from the past 240 hour(s))  CULTURE, BLOOD (ROUTINE X 2)     Status: None   Collection Time    06/26/12 11:40 AM      Result Value Range Status   Specimen Description BLOOD RIGHT ARM   Final   Special Requests BOTTLES DRAWN AEROBIC AND ANAEROBIC 10CC   Final   Culture  Setup Time 06/26/2012 16:08   Final   Culture     Final   Value:        BLOOD CULTURE RECEIVED NO GROWTH TO DATE CULTURE WILL BE HELD FOR 5 DAYS BEFORE ISSUING A FINAL NEGATIVE REPORT   Report Status PENDING   Incomplete  CULTURE, BLOOD (ROUTINE X 2)     Status: None   Collection Time    06/26/12 11:59 AM      Result Value Range Status   Specimen Description BLOOD LEFT ARM   Final   Special Requests BOTTLES DRAWN AEROBIC AND  ANAEROBIC 5CC   Final   Culture  Setup Time 06/26/2012 16:08   Final   Culture     Final   Value:        BLOOD CULTURE RECEIVED NO GROWTH TO DATE CULTURE WILL BE HELD FOR 5 DAYS BEFORE ISSUING A FINAL NEGATIVE REPORT   Report Status PENDING   Incomplete  CULTURE, EXPECTORATED SPUTUM-ASSESSMENT     Status: None   Collection Time    06/27/12 10:53 AM      Result Value Range Status   Specimen Description SPUTUM   Final   Special Requests NONE   Final   Sputum evaluation     Final   Value: THIS SPECIMEN IS ACCEPTABLE.  RESPIRATORY CULTURE REPORT TO FOLLOW.   Report Status 06/27/2012 FINAL   Final    Scheduled Meds: . aspirin EC  81 mg Oral Daily  . atenolol  25 mg Oral Daily  . azithromycin  500 mg Oral Q24H  . cefTRIAXone  IV  1 g Intravenous Q24H  . enoxaparin injection  40 mg Subcutaneous Q24H  . ezetimibe-simvastatin  1 tablet Oral QHS  . mometasone-formoterol  2 puff Inhalation BID  . pantoprazole  40 mg Oral Daily   Continuous Infusions:   Faye Ramsay, MD  [facility] Pager [phone]  If 7PM-7AM, please contact night-coverage [email or web address] Password [email or web address] 06/27/2012, 1:40 PM   LOS: 2 days

## 2012-06-27 NOTE — Progress Notes (Signed)
CARE MANAGEMENT NOTE 06/27/2012  Patient:  [name] [name], [name] [name]   Account Number:  [id number]  Date Initiated:  06/27/2012  Documentation initiated by:  Olga Coaster  Subjective/Objective Assessment:   ADMITTED WITH UNSTEADY GAIT     Action/Plan:   LIVES AT [place]; POSSIBLY NEED [place]; CM FOLLOWING FOR DCP   Anticipated DC Date:  06/30/2012   Anticipated DC Plan:  [place]  CM consult            Status of service:  In process, will continue to follow Medicare Important Message given?  NA - LOS <3 / Initial given by admissions (If response is "NO", the following Medicare IM given date fields will be blank)   Per UR Regulation:  Reviewed for med. necessity/level of care/duration of stay Comments:  06/27/2012- B Quincie Haroon RN, Leory Plowman

## 2012-06-27 NOTE — Evaluation (Addendum)
Physical Therapy Evaluation Patient Details Name: [name] [name] MRN: [id number] DOB: [birth year] Today's Date: 06/27/2012 Time: 5400-8676 PT Time Calculation (min): 20 min  PT Assessment / Plan / Recommendation Clinical Impression  Pt is a 62 year old female admitted with unsteady gait.  Pt reports feeling more steady today and able to ambulate 400 feet with RW without LOB.  Pt would benefit from acute PT services in order to improve independence with ambulation and improve balance prior to d/c home.  Pt agreeable to HHPT upon d/c    PT Assessment  Patient needs continued PT services    Follow Up Recommendations  Home health PT    Does the patient have the potential to tolerate intense rehabilitation      Barriers to Discharge        Equipment Recommendations  Rolling walker with 5" wheels    Recommendations for Other Services     Frequency Min 3X/week    Precautions / Restrictions Precautions Precautions: Fall   Pertinent Vitals/Pain N/a, remained on O2 [place] throughout session      Mobility  Bed Mobility Bed Mobility: Supine to Sit Supine to Sit: 6: Modified independent (Device/Increase time);HOB elevated Transfers Transfers: Sit to Stand;Stand to Sit Sit to Stand: 4: Min guard;From bed Stand to Sit: 4: Min guard;To chair/3-in-1 Details for Transfer Assistance: verbal cues for safe technique Ambulation/Gait Ambulation/Gait Assistance: 4: Min guard Ambulation Distance (Feet): 400 Feet Assistive device: Rolling walker Ambulation/Gait Assistance Details: pt ambulated to doorway of room without AD however felt very unsteady (no LOB observed) so preferred to use RW for safety, verbal cues for safe use of RW Gait Pattern: Step-through pattern General Gait Details: remained on O2 [place]    Exercises     PT Diagnosis: Difficulty walking  PT Problem List: Decreased activity tolerance;Decreased balance;Decreased mobility;Decreased knowledge of use of DME PT  Treatment Interventions: DME instruction;Gait training;Functional mobility training;Therapeutic activities;Stair training;Therapeutic exercise;Patient/family education;Balance training;Neuromuscular re-education   PT Goals Acute Rehab PT Goals PT Goal Formulation: With patient Time For Goal Achievement: 07/11/12 Potential to Achieve Goals: Good Pt will go Sit to Stand: with modified independence PT Goal: Sit to Stand - Progress: Goal set today Pt will go Stand to Sit: with modified independence PT Goal: Stand to Sit - Progress: Goal set today Pt will Ambulate: 51 - 150 feet;with modified independence;with least restrictive assistive device PT Goal: Ambulate - Progress: Goal set today Pt will Go Up / Down Stairs: 3-5 stairs;with supervision PT Goal: Up/Down Stairs - Progress: Goal set today Additional Goals Additional Goal #1: Pt will improve balance by increasing SLS time to 12 seconds each LE. PT Goal: Additional Goal #1 - Progress: Goal set today  Visit Information  Last PT Received On: 06/27/12 Assistance Needed: +1    Subjective Data  Subjective: I couldn't walk when I came in but I'm doing better now. Patient Stated Goal: home with spouse   Prior Functioning  Home Living Lives With: Spouse Type of Home: House Home Access: Stairs to enter Technical brewer of Steps: 3-4 Entrance Stairs-Rails: None Home Layout: One level Home Adaptive Equipment: None Prior Function Level of Independence: Independent Comments: chronic 2.5 L O2 at home Communication Communication: No difficulties    Cognition  Cognition Arousal/Alertness: Awake/alert Behavior During Therapy: WFL for tasks assessed/performed Overall Cognitive Status: Within Functional Limits for tasks assessed    Extremity/Trunk Assessment Right Upper Extremity Assessment RUE ROM/Strength/Tone: [facility] for tasks assessed Left Upper Extremity Assessment LUE ROM/Strength/Tone: [facility] for tasks  assessed Right Lower  Extremity Assessment RLE ROM/Strength/Tone: [facility] for tasks assessed;Within functional levels Left Lower Extremity Assessment LLE ROM/Strength/Tone: Within functional levels   Balance Balance Balance Assessed: Yes Static Standing Balance Single Leg Stance - Right Leg: 5 Single Leg Stance - Left Leg: 7 Dynamic Standing Balance Dynamic Standing - Balance Support: No upper extremity supported Dynamic Standing - Level of Assistance: 5: Stand by assistance Dynamic Standing - Balance Activities: Forward lean/weight shifting Dynamic Standing - Comments: pt able to reach approx 5 in forward without LOB  End of Session PT - End of Session Equipment Utilized During Treatment: Oxygen Activity Tolerance: Patient tolerated treatment well Patient left: in chair;with call bell/phone within reach;with chair alarm set  GP     Mahki Spikes,KATHrine E 06/27/2012, 4:32 PM Carmelia Bake, PT, DPT 06/27/2012 Pager: [phone]

## 2012-06-27 NOTE — Progress Notes (Addendum)
NEURO HOSPITALIST PROGRESS NOTE   SUBJECTIVE:                                                                                                                        Patient feels better today, is less confused. Breathing improved.   OBJECTIVE:                                                                                                                           Vital signs in last 24 hours: Temp:  [98.3 F (36.8 C)-98.7 F (37.1 C)] 98.7 F (37.1 C) (04/22 0503) Pulse Rate:  [59-67] 67 (04/22 0503) Resp:  [18-22] 22 (04/22 0503) BP: (137-156)/(69-76) 156/76 mmHg (04/22 0503) SpO2:  [97 %-100 %] 99 % (04/22 0826)  Intake/Output from previous day: 04/21 0701 - 04/22 0700 In: 600 [P.O.:600] Out: 1250 [Urine:1250] Intake/Output this shift:   Nutritional status: Cardiac  Past Medical History  Diagnosis Date  . COPD (chronic obstructive pulmonary disease)   . Hypertension   . Hypercholesteremia   . GERD (gastroesophageal reflux disease)   . IBS (irritable bowel syndrome)   . DJD (degenerative joint disease)   . Cervical disc disease   . Fibromyalgia   . Vitamin D deficiency   . Chronic pain syndrome   . Chronic headache   . Anxiety disorder      Neurologic Exam:  Mental Status: Alert, oriented, thought content appropriate.  Speech fluent without evidence of aphasia.  Able to follow 3 step commands without difficulty. Cranial Nerves: II: Visual fields grossly normal, pupils equal, round, reactive to light and accommodation III,IV, VI: ptosis not present, extra-ocular motions intact bilaterally V,VII: smile symmetric, facial light touch sensation normal bilaterally VIII: hearing normal bilaterally IX,X: gag reflex present XI: bilateral shoulder shrug XII: midline tongue extension Motor: Right : Upper extremity   5/5    Left:     Upper extremity   5/5  Lower extremity   5/5     Lower extremity   5/5 --no myoclonus today Tone  and bulk:normal tone throughout; no atrophy noted Sensory: Pinprick and light touch intact throughout, bilaterally Deep Tendon Reflexes: 2+ and symmetric throughout Cerebellar: normal finger-to-nose,  normal heel-to-shin test CV: pulses palpable throughout  Lab Results: Lab Results  Component Value Date/Time   CHOL 186 06/22/2011 12:11 PM   Lipid Panel No results found for this basename: CHOL, TRIG, HDL, CHOLHDL, VLDL, LDLCALC,  in the last 72 hours  Studies/Results: Dg Chest 2 View  06/25/2012  *RADIOLOGY REPORT*  Clinical Data: Altered mental status.  Cough and leukocytosis.  CHEST - 2 VIEW  Comparison: 04/10/2012  Findings: Mild hyperinflation. Midline trachea.  Normal heart size and mediastinal contours for age.  No pleural effusion or pneumothorax.  Mild lower lobe predominant interstitial thickening. Mild scarring at the left lung base.  IMPRESSION: No acute cardiopulmonary disease.  COPD/chronic bronchitis.   Original Report Authenticated By: Abigail Miyamoto, M.D.    Ct Head Wo Contrast  06/25/2012  *RADIOLOGY REPORT*  Clinical Data: Altered mental status, slurred speech  CT HEAD WITHOUT CONTRAST  Technique:  Contiguous axial images were obtained from the base of the skull through the vertex without contrast.  Comparison: None.  Findings: There is no evidence for acute hemorrhage, hydrocephalus, mass lesion, or abnormal extra-axial fluid collection.  No definite CT evidence for acute infarction.  Opacified right greater left ethmoid air cells are and left sphenoid chamber.  Maxillary sinuses not imaged.  Mastoid air cells clear.  IMPRESSION: No acute intracranial abnormality.  Partially opacified paranasal sinuses may reflect acute sinusitis.   Original Report Authenticated By: Carlos Levering, M.D.    Mr [facility] Wo Contrast  06/26/2012  *RADIOLOGY REPORT*  Clinical Data:  New onset of confusion.  Slurred speech yesterday. Unsteady gait.  Abnormal cerebellar exam.  MRI HEAD WITHOUT AND  WITH CONTRAST MRA HEAD WITHOUT CONTRAST  Technique:  Multiplanar, multiecho pulse sequences of the brain and surrounding structures were obtained without and with intravenous contrast.  Angiographic images of the head were obtained using MRA technique without contrast.  Contrast: 62m MULTIHANCE GADOBENATE DIMEGLUMINE 529 MG/ML IV SOLN  Comparison:  CT head 06/25/2012.  MRI HEAD WITHOUT AND WITH CONTRAST  Findings:  The diffusion weighted images demonstrate no evidence for acute or subacute infarction.  Mild periventricular and scattered subcortical T2 hyperintensities are greater than expected for age.  No acute hemorrhage or mass lesion is present.  The ventricles are of normal size.  No significant extra-axial fluid collection is present.  The globes and orbits are intact.  Fluid levels present in the right maxillary sinus.  Mild mucosal thickening is present in the left maxillary sinus.  There is scattered opacification of anterior ethmoid air cells bilaterally, right greater than left.  The left sphenoid sinus is opacified. Mucosal thickening is noted in the frontal sinuses bilaterally. The mastoid air cells are clear.  The postcontrast images demonstrate no pathologic enhancement of the brain or meninges.  IMPRESSION:  1.  Scattered periventricular and subcortical T2 foci are present bilaterally.  There is no associated enhancement or diffusion restricted effusion. The finding is nonspecific but can be seen in the setting of chronic microvascular ischemia, a demyelinating process such as multiple sclerosis, vasculitis, complicated migraine headaches, or as the sequelae of a prior infectious or inflammatory process. 2.  Diffuse paranasal sinus disease is greatest in the right maxillary and falling air cells.  MRA HEAD  Findings: The internal carotid arteries are within normal limits from high cervical segments through the ICA termini bilaterally. There is asymmetric signal loss within the left A1 segment.  The  M1 segments are within normal limits bilaterally.  The MCA bifurcations are normal.  Segmental attenuation of MCA branch vessels  bilaterally is symmetric.  The right vertebral artery terminates at the PICA.  The left PICA origin is visualized and normal.  The basilar artery is within normal limits.  The left posterior cerebral artery is of fetal type.  A tiny left P1 segment is evident.  Segmental attenuation of distal PCA branch vessels is noted bilaterally.  IMPRESSION:  1.  Moderate small vessel disease.  This suggests the white matter changes of the brain are likely secondary to chronic microvascular ischemic change. 2.  Significant stenoses with signal loss in the proximal left A1 segment. 3.  No significant proximal stenosis, aneurysm, or branch vessel occlusion.   Original Report Authenticated By: San Morelle, M.D.    Mr [facility] Wo Contrast  06/26/2012  *RADIOLOGY REPORT*  Clinical Data:  New onset of confusion.  Slurred speech yesterday. Unsteady gait.  Abnormal cerebellar exam.  MRI HEAD WITHOUT AND WITH CONTRAST MRA HEAD WITHOUT CONTRAST  Technique:  Multiplanar, multiecho pulse sequences of the brain and surrounding structures were obtained without and with intravenous contrast.  Angiographic images of the head were obtained using MRA technique without contrast.  Contrast: 56m MULTIHANCE GADOBENATE DIMEGLUMINE 529 MG/ML IV SOLN  Comparison:  CT head 06/25/2012.  MRI HEAD WITHOUT AND WITH CONTRAST  Findings:  The diffusion weighted images demonstrate no evidence for acute or subacute infarction.  Mild periventricular and scattered subcortical T2 hyperintensities are greater than expected for age.  No acute hemorrhage or mass lesion is present.  The ventricles are of normal size.  No significant extra-axial fluid collection is present.  The globes and orbits are intact.  Fluid levels present in the right maxillary sinus.  Mild mucosal thickening is present in the left maxillary sinus.  There is  scattered opacification of anterior ethmoid air cells bilaterally, right greater than left.  The left sphenoid sinus is opacified. Mucosal thickening is noted in the frontal sinuses bilaterally. The mastoid air cells are clear.  The postcontrast images demonstrate no pathologic enhancement of the brain or meninges.  IMPRESSION:  1.  Scattered periventricular and subcortical T2 foci are present bilaterally.  There is no associated enhancement or diffusion restricted effusion. The finding is nonspecific but can be seen in the setting of chronic microvascular ischemia, a demyelinating process such as multiple sclerosis, vasculitis, complicated migraine headaches, or as the sequelae of a prior infectious or inflammatory process. 2.  Diffuse paranasal sinus disease is greatest in the right maxillary and falling air cells.  MRA HEAD  Findings: The internal carotid arteries are within normal limits from high cervical segments through the ICA termini bilaterally. There is asymmetric signal loss within the left A1 segment.  The M1 segments are within normal limits bilaterally.  The MCA bifurcations are normal.  Segmental attenuation of MCA branch vessels bilaterally is symmetric.  The right vertebral artery terminates at the PICA.  The left PICA origin is visualized and normal.  The basilar artery is within normal limits.  The left posterior cerebral artery is of fetal type.  A tiny left P1 segment is evident.  Segmental attenuation of distal PCA branch vessels is noted bilaterally.  IMPRESSION:  1.  Moderate small vessel disease.  This suggests the white matter changes of the brain are likely secondary to chronic microvascular ischemic change. 2.  Significant stenoses with signal loss in the proximal left A1 segment. 3.  No significant proximal stenosis, aneurysm, or branch vessel occlusion.   Original Report Authenticated By: CSan Morelle M.D.  MEDICATIONS                                                                                                                         Scheduled: . aspirin EC  81 mg Oral Daily  . atenolol  25 mg Oral Daily  . azithromycin  500 mg Oral Q24H  . cefTRIAXone (ROCEPHIN)  IV  1 g Intravenous Q24H  . enoxaparin (LOVENOX) injection  40 mg Subcutaneous Q24H  . ezetimibe-simvastatin  1 tablet Oral QHS  . mometasone-formoterol  2 puff Inhalation BID  . pantoprazole  40 mg Oral Daily  . sodium chloride  3 mL Intravenous Q12H    ASSESSMENT/PLAN:                                                                                                            Impression: 62 yo with confusion of unknown duration, but less than 4 days that has improved overnight. Question Delirium on top of dementia in setting bronchitis.  desipramine as anticholinergic effects can exacerbate delirium and would encourage further evaluation by out patient PCP.  Patient currently is alert, oriented, able to name objects and recall objects after 5 minutes and spell [id number] backward. MRI shows no acute infarct. EEG pending.    Assessment and plan discussed with with attending physician and they are in agreement.    Etta Quill PA-C [facility] [phone]  06/27/2012, 8:30 AM   I have seen the pateitn and reviewed the above note, on my exam, continued difficulty with world, [id number]. Her mentation seems much improved. Mildly unsteady gait, but not grossly ataxic. .   With her history of not driving anymore due to getting lost and progressive memory difficulty, I strongly suspect an underlying MCI or dementia with delirium superimposed. Agree with PT for gait.   MRI with only non-specific white matter changes which I do not feel are of clinical significance. Desipramine may have been an exacerbating factor and I recommended to the patient to stop it. She can discuss alternative depression medicines with fewer anti-cholinergic side effects with PCP.   EEG was not performed today and  I no longer feel it to be clinically necessary. At this time, given improvement, no further recommendations and neurology will sign off.    Roland Rack, MD [facility] [phone]  If 7pm- 7am, please page neurology on call at [phone].

## 2012-06-27 NOTE — Progress Notes (Signed)
*  PRELIMINARY RESULTS* Echocardiogram 2D Echocardiogram has been performed.  Leavy Cella 06/27/2012, 3:31 PM

## 2012-06-28 ENCOUNTER — Other Ambulatory Visit (HOSPITAL_COMMUNITY): Payer: Medicare Other

## 2012-06-28 DIAGNOSIS — I421 Obstructive hypertrophic cardiomyopathy: Secondary | ICD-10-CM | POA: Diagnosis present

## 2012-06-28 DIAGNOSIS — J9620 Acute and chronic respiratory failure, unspecified whether with hypoxia or hypercapnia: Secondary | ICD-10-CM

## 2012-06-28 DIAGNOSIS — D72829 Elevated white blood cell count, unspecified: Secondary | ICD-10-CM

## 2012-06-28 DIAGNOSIS — K219 Gastro-esophageal reflux disease without esophagitis: Secondary | ICD-10-CM

## 2012-06-28 LAB — URINE CULTURE

## 2012-06-28 LAB — CBC
HCT: 44.8 % (ref 36.0–46.0)
RDW: 13.8 % (ref 11.5–15.5)
WBC: 12.3 10*3/uL — ABNORMAL HIGH (ref 4.0–10.5)

## 2012-06-28 LAB — BASIC METABOLIC PANEL
BUN: 13 mg/dL (ref 6–23)
Chloride: 100 mEq/L (ref 96–112)
GFR calc Af Amer: >90 mL/min (ref >90)
Potassium: 3.6 mEq/L (ref 3.5–5.1)

## 2012-06-28 MED ORDER — DULOXETINE HCL 30 MG PO CPEP: 30.0000 mg | ORAL_CAPSULE | Freq: Every day | ORAL | Status: DC

## 2012-06-28 MED ORDER — AZITHROMYCIN 250 MG PO TABS
250.0000 mg | ORAL_TABLET | ORAL | Status: AC
Start: 2012-06-28 — End: 2012-06-30

## 2012-06-28 NOTE — Progress Notes (Signed)
Patient evaluated for long-term disease management services with [facility] as a benefit of her [employer]. Met with patient at bedside prior to discharge. Explained that [facility] will not interfere or replace home health. Consents signed at bedside. Patient will receive a post discharge transition of care call and monthly home visits for assessments and for education. Left [facility] Care Management packet at bedside. Appreciative of visit.  Marthenia Rolling, MSN-Ed, RN,BSN, [facility], [phone]

## 2012-06-28 NOTE — Discharge Summary (Signed)
Physician Discharge Summary  PRICILLA [name] ZTI:[id number] DOB: 1950/05/09 DOA: 06/25/2012  PCP: No primary provider on file.  Admit date: 06/25/2012 Discharge date: 06/28/2012  Time spent:55 minutes  Recommendations for Outpatient Follow-up:  1. Home health physical therapy 2. Followup with psychiatrist for further management of depression 3. Follow up with PCP for unsteady gait  4. -Note: new diagnosis of HOCM  Discharge Diagnoses:  Principal Problem:   Unsteady gait Active Problems:   Acute and chronic respiratory failure HOCM (hypertrophic obstructive cardiomyopathy)   HYPERCHOLESTEROLEMIA   Chronic pain syndrome   HYPERTENSION   GERD   Hypokalemia   Hyponatremia   Elevated WBC count Depression       Discharge Condition: Stable  Diet recommendation: Heart Healthy  Filed Weights   06/25/12 2009 06/26/12 0258 06/28/12 0545  Weight: 72.576 kg (160 lb) 68.221 kg (150 lb 6.4 oz) 67.223 kg (148 lb 3.2 oz)    History of present illness:  Patient is a 62 year old female with past medical history significant for COPD on home O2, hypertension, hypercholesterolemia, reflux disease, degenerative disc disease, fibromyalgia and chronic pain syndrome who comes in today with chief complaints of unsteady gait. Patient was apparently well up until 3 days ago when she started having difficulty walking. Difficulty in walking was also associated with jerking movements in her entire trunk. There were no exacerbating or relieving factors. Patient has also noted memory impairment, tangential speech and difficulty finding words since last one week. Pt denies headache, dizziness, vision changes, dysphagia, paresthesias. No recent head trauma. No recent illnesses including fever, CP, SOB, worse than baseline cough, diarrhea, urinary sx. Had single episode of vomiting yesterday. She administers her own medications and is unsure of what dosages she is on or when she last took her norco for  chronic pain.  Dr. Wyvonnia Dusky spoke w/ Dr. Chase Caller and he is suspicious that patient could have opioid neurotoxicity or opioid withdrawal. Initial plan was to discharge the patient but since she was unsteady on her feet, it was decided to admit the patient.   Hospital Course:  Unsteady gait/confusion  - unclear etiology at this time, appreciate neurology input - their recommendations are to discontinue the Desimipramine due to its anticholinergic side effects but they do recommend further outpatient I'll -MRI of the brain revealed white matter changes which are suspected to be secondary to chronic microvascular ischemia -We have requested a PT eval and the patient will receive home health PT  Active Problems:  Acute and chronic respiratory failure  - most likely secondary to COPD, acute bronchitis-WBC count on admission was 22.7 but part of this was due to hemoconcentration-has improved to 12.3 today - will continue Zithromax as an outpatient to complete a five-day course-the patient was also on Rocephin in addition to Zithromax while in the hospital.  - She states that her cough has significantly improved  HOCM /grade 1 diastolic dysfunction -This is a new finding per echo which was performed on 4/22 -Echo report reveals hypertrophic obstructive cardiomyopathy with mild to moderate LV outflow obstruction at rest -The patient was given a couple of small doses of Lasix as it was suspected that she may have been fluid overloaded In addition to having bronchitis -She diuresed well and is about 5 L in negative balance today -Patient is already on a beta blocker which is the appropriate treatment -Would recommend further outpatient follow-up  Depression -Patient is asking for further treatment of this especially as her Desimipramine is now being discontinued -She  has tried Cymbalta in the past but it did make her sleepy - she states that she would like to retry it and therefore we have discussed  starting it at a low dose of 30 mg daily with plans for her to make an outpatient psychiatry appointment and followup in regards to further adjusting the dosage  HYPERTENSION  - reasonable BP control in the hospital   Hypokalemia  - supplemented and corrected  Hyponatremia  -May have been related to fluid overload and has now improved with diuresis  Elevated WBC count  - likely secondary to acute bronchitis, now trending down  - continue ABX as noted above   HYPERCHOLESTEROLEMIA  - continue statin   Chronic pain syndrome  - analgesia as needed   GERD  - protonix    Procedures:  None  Consultations:  Neurology   Discharge Exam: Filed Vitals:   06/27/12 2306 06/28/12 0545 06/28/12 0758 06/28/12 1407  BP: 122/52 136/63  107/59  Pulse: 73 80  65  Temp: 98.8 F (37.1 C) 97.5 F (36.4 C)  98.4 F (36.9 C)  TempSrc: Oral Oral  Oral  Resp: _0 Height:      Weight:  67.223 kg (148 lb 3.2 oz)    SpO2: 98% 92% 100% 98%    General: Awake alert oriented x3, extremely talkative, in no acute distress Cardiovascular: Regular rate and rhythm with 2/6 murmur at the apex Respiratory: Clear to auscultation-oxygen level is 98% at rest on room air  Discharge Instructions  Discharge Orders   Future Appointments Provider Department Dept Phone   10/13/2012 9:30 AM Noralee Space, MD [facility] [phone]   Future Orders Complete By Expires     Diet - low sodium heart healthy  As directed     Discharge instructions  As directed     Comments:      See Dr Lenna Gilford in 1 week    Increase activity slowly  As directed         Medication List    STOP taking these medications       desipramine 100 MG tablet  Commonly known as:  NOPRAMIN      TAKE these medications       albuterol 108 (90 BASE) MCG/ACT inhaler  Commonly known as:  PROAIR HFA  Inhale 2 puffs into the lungs every 4 (four) hours as needed for wheezing.     ALPRAZolam 1 MG tablet  Commonly  known as:  XANAX  Take 1 tablet (1 mg total) by mouth 3 (three) times daily as needed. As directed by Dr. Erling Cruz     atenolol 25 MG tablet  Commonly known as:  TENORMIN  Take 1 tablet (25 mg total) by mouth daily.     azithromycin 250 MG tablet  Commonly known as:  ZITHROMAX  Take 1 tablet (250 mg total) by mouth daily.     chlorpheniramine-HYDROcodone 10-8 MG/5ML Lqcr  Commonly known as:  TUSSIONEX PENNKINETIC ER  1 tsp by mouth every 12 hours as needed for cough     DULoxetine 30 MG capsule  Commonly known as:  CYMBALTA  Take 1 capsule (30 mg total) by mouth daily.     ezetimibe-simvastatin 10-80 MG per tablet  Commonly known as:  VYTORIN  Take 1 tablet by mouth at bedtime.     FIRST-DUKES MOUTHWASH Susp  1 tsp swish and swallow four times daily as needed     Fluticasone-Salmeterol 250-50 MCG/DOSE Aepb  Commonly known as:  ADVAIR DISKUS  Inhale 1 puff into the lungs every 12 (twelve) hours.     HYDROcodone-acetaminophen 10-325 MG per tablet  Commonly known as:  NORCO  Take 1 tablet by mouth every 6 (six) hours as needed for pain.     omeprazole 40 MG capsule  Commonly known as:  PRILOSEC  Take 1 capsule (40 mg total) by mouth 2 (two) times daily.     RETIN-A 0.025 % cream  Generic drug:  tretinoin  Apply 1 application topically at bedtime. Or as directed          The results of significant diagnostics from this hospitalization (including imaging, microbiology, ancillary and laboratory) are listed below for reference.    Significant Diagnostic Studies: Dg Chest 2 View  06/25/2012  *RADIOLOGY REPORT*  Clinical Data: Altered mental status.  Cough and leukocytosis.  CHEST - 2 VIEW  Comparison: 04/10/2012  Findings: Mild hyperinflation. Midline trachea.  Normal heart size and mediastinal contours for age.  No pleural effusion or pneumothorax.  Mild lower lobe predominant interstitial thickening. Mild scarring at the left lung base.  IMPRESSION: No acute cardiopulmonary  disease.  COPD/chronic bronchitis.   Original Report Authenticated By: Abigail Miyamoto, M.D.    Ct Head Wo Contrast  06/25/2012  *RADIOLOGY REPORT*  Clinical Data: Altered mental status, slurred speech  CT HEAD WITHOUT CONTRAST  Technique:  Contiguous axial images were obtained from the base of the skull through the vertex without contrast.  Comparison: None.  Findings: There is no evidence for acute hemorrhage, hydrocephalus, mass lesion, or abnormal extra-axial fluid collection.  No definite CT evidence for acute infarction.  Opacified right greater left ethmoid air cells are and left sphenoid chamber.  Maxillary sinuses not imaged.  Mastoid air cells clear.  IMPRESSION: No acute intracranial abnormality.  Partially opacified paranasal sinuses may reflect acute sinusitis.   Original Report Authenticated By: Carlos Levering, M.D.    Mr [facility] Wo Contrast  06/26/2012  *RADIOLOGY REPORT*  Clinical Data:  New onset of confusion.  Slurred speech yesterday. Unsteady gait.  Abnormal cerebellar exam.  MRI HEAD WITHOUT AND WITH CONTRAST MRA HEAD WITHOUT CONTRAST  Technique:  Multiplanar, multiecho pulse sequences of the brain and surrounding structures were obtained without and with intravenous contrast.  Angiographic images of the head were obtained using MRA technique without contrast.  Contrast: 29m MULTIHANCE GADOBENATE DIMEGLUMINE 529 MG/ML IV SOLN  Comparison:  CT head 06/25/2012.  MRI HEAD WITHOUT AND WITH CONTRAST  Findings:  The diffusion weighted images demonstrate no evidence for acute or subacute infarction.  Mild periventricular and scattered subcortical T2 hyperintensities are greater than expected for age.  No acute hemorrhage or mass lesion is present.  The ventricles are of normal size.  No significant extra-axial fluid collection is present.  The globes and orbits are intact.  Fluid levels present in the right maxillary sinus.  Mild mucosal thickening is present in the left maxillary sinus.  There  is scattered opacification of anterior ethmoid air cells bilaterally, right greater than left.  The left sphenoid sinus is opacified. Mucosal thickening is noted in the frontal sinuses bilaterally. The mastoid air cells are clear.  The postcontrast images demonstrate no pathologic enhancement of the brain or meninges.  IMPRESSION:  1.  Scattered periventricular and subcortical T2 foci are present bilaterally.  There is no associated enhancement or diffusion restricted effusion. The finding is nonspecific but can be seen in the setting of chronic microvascular ischemia, a  demyelinating process such as multiple sclerosis, vasculitis, complicated migraine headaches, or as the sequelae of a prior infectious or inflammatory process. 2.  Diffuse paranasal sinus disease is greatest in the right maxillary and falling air cells.  MRA HEAD  Findings: The internal carotid arteries are within normal limits from high cervical segments through the ICA termini bilaterally. There is asymmetric signal loss within the left A1 segment.  The M1 segments are within normal limits bilaterally.  The MCA bifurcations are normal.  Segmental attenuation of MCA branch vessels bilaterally is symmetric.  The right vertebral artery terminates at the PICA.  The left PICA origin is visualized and normal.  The basilar artery is within normal limits.  The left posterior cerebral artery is of fetal type.  A tiny left P1 segment is evident.  Segmental attenuation of distal PCA branch vessels is noted bilaterally.  IMPRESSION:  1.  Moderate small vessel disease.  This suggests the white matter changes of the brain are likely secondary to chronic microvascular ischemic change. 2.  Significant stenoses with signal loss in the proximal left A1 segment. 3.  No significant proximal stenosis, aneurysm, or branch vessel occlusion.   Original Report Authenticated By: San Morelle, M.D.    Mr [facility] Wo Contrast  06/26/2012  *RADIOLOGY REPORT*   Clinical Data:  New onset of confusion.  Slurred speech yesterday. Unsteady gait.  Abnormal cerebellar exam.  MRI HEAD WITHOUT AND WITH CONTRAST MRA HEAD WITHOUT CONTRAST  Technique:  Multiplanar, multiecho pulse sequences of the brain and surrounding structures were obtained without and with intravenous contrast.  Angiographic images of the head were obtained using MRA technique without contrast.  Contrast: 93m MULTIHANCE GADOBENATE DIMEGLUMINE 529 MG/ML IV SOLN  Comparison:  CT head 06/25/2012.  MRI HEAD WITHOUT AND WITH CONTRAST  Findings:  The diffusion weighted images demonstrate no evidence for acute or subacute infarction.  Mild periventricular and scattered subcortical T2 hyperintensities are greater than expected for age.  No acute hemorrhage or mass lesion is present.  The ventricles are of normal size.  No significant extra-axial fluid collection is present.  The globes and orbits are intact.  Fluid levels present in the right maxillary sinus.  Mild mucosal thickening is present in the left maxillary sinus.  There is scattered opacification of anterior ethmoid air cells bilaterally, right greater than left.  The left sphenoid sinus is opacified. Mucosal thickening is noted in the frontal sinuses bilaterally. The mastoid air cells are clear.  The postcontrast images demonstrate no pathologic enhancement of the brain or meninges.  IMPRESSION:  1.  Scattered periventricular and subcortical T2 foci are present bilaterally.  There is no associated enhancement or diffusion restricted effusion. The finding is nonspecific but can be seen in the setting of chronic microvascular ischemia, a demyelinating process such as multiple sclerosis, vasculitis, complicated migraine headaches, or as the sequelae of a prior infectious or inflammatory process. 2.  Diffuse paranasal sinus disease is greatest in the right maxillary and falling air cells.  MRA HEAD  Findings: The internal carotid arteries are within normal limits  from high cervical segments through the ICA termini bilaterally. There is asymmetric signal loss within the left A1 segment.  The M1 segments are within normal limits bilaterally.  The MCA bifurcations are normal.  Segmental attenuation of MCA branch vessels bilaterally is symmetric.  The right vertebral artery terminates at the PICA.  The left PICA origin is visualized and normal.  The basilar artery is within normal limits.  The left posterior cerebral artery is of fetal type.  A tiny left P1 segment is evident.  Segmental attenuation of distal PCA branch vessels is noted bilaterally.  IMPRESSION:  1.  Moderate small vessel disease.  This suggests the white matter changes of the brain are likely secondary to chronic microvascular ischemic change. 2.  Significant stenoses with signal loss in the proximal left A1 segment. 3.  No significant proximal stenosis, aneurysm, or branch vessel occlusion.   Original Report Authenticated By: San Morelle, M.D.     Microbiology: Recent Results (from the past 240 hour(s))  CULTURE, BLOOD (ROUTINE X 2)     Status: None   Collection Time    06/26/12 11:40 AM      Result Value Range Status   Specimen Description BLOOD RIGHT ARM   Final   Special Requests BOTTLES DRAWN AEROBIC AND ANAEROBIC 10CC   Final   Culture  Setup Time 06/26/2012 16:08   Final   Culture     Final   Value:        BLOOD CULTURE RECEIVED NO GROWTH TO DATE CULTURE WILL BE HELD FOR 5 DAYS BEFORE ISSUING A FINAL NEGATIVE REPORT   Report Status PENDING   Incomplete  CULTURE, BLOOD (ROUTINE X 2)     Status: None   Collection Time    06/26/12 11:59 AM      Result Value Range Status   Specimen Description BLOOD LEFT ARM   Final   Special Requests BOTTLES DRAWN AEROBIC AND ANAEROBIC 5CC   Final   Culture  Setup Time 06/26/2012 16:08   Final   Culture     Final   Value:        BLOOD CULTURE RECEIVED NO GROWTH TO DATE CULTURE WILL BE HELD FOR 5 DAYS BEFORE ISSUING A FINAL NEGATIVE REPORT    Report Status PENDING   Incomplete  URINE CULTURE     Status: None   Collection Time    06/26/12 10:40 PM      Result Value Range Status   Specimen Description URINE, RANDOM   Final   Special Requests NONE   Final   Culture  Setup Time 06/27/2012 06:56   Final   Colony Count NO GROWTH   Final   Culture NO GROWTH   Final   Report Status 06/28/2012 FINAL   Final  CULTURE, EXPECTORATED SPUTUM-ASSESSMENT     Status: None   Collection Time    06/27/12 10:53 AM      Result Value Range Status   Specimen Description SPUTUM   Final   Special Requests NONE   Final   Sputum evaluation     Final   Value: THIS SPECIMEN IS ACCEPTABLE. RESPIRATORY CULTURE REPORT TO FOLLOW.   Report Status 06/27/2012 FINAL   Final  CULTURE, RESPIRATORY (NON-EXPECTORATED)     Status: None   Collection Time    06/27/12 10:53 AM      Result Value Range Status   Specimen Description SPUTUM   Final   Special Requests NONE   Final   Gram Stain     Final   Value: FEW WBC PRESENT, PREDOMINANTLY PMN     RARE SQUAMOUS EPITHELIAL CELLS PRESENT     RARE GRAM POSITIVE COCCI     IN PAIRS RARE GRAM POSITIVE RODS   Culture NORMAL OROPHARYNGEAL FLORA   Final   Report Status PENDING   Incomplete     Labs: Basic Metabolic Panel:  Recent Labs Lab 06/25/12 2100  06/27/12 0438 06/28/12 0453  NA 131* 140 141  K 3.2* 3.4* 3.6  CL 95* 104 100  CO2 26 31 34*  GLUCOSE 130* 112* 121*  BUN _0 CREATININE 0.51 0.65 0.69  CALCIUM 10.0 9.0 9.4   Liver Function Tests:  Recent Labs Lab 06/25/12 2100  AST 38*  ALT 19  ALKPHOS 78  BILITOT 0.7  PROT 7.4  ALBUMIN 3.9   No results found for this basename: LIPASE, AMYLASE,  in the last 168 hours  Recent Labs Lab 06/26/12 1152  AMMONIA 20   CBC:  Recent Labs Lab 06/25/12 2100 06/26/12 1152 06/27/12 0438 06/28/12 0453  WBC 22.7* 12.1* 11.7* 12.3*  NEUTROABS 17.8*  --   --   --   HGB 17.1* 14.9 14.1 14.0  HCT 50.2* 45.9 44.1 44.8  MCV 90.1 93.1 93.8 94.5   PLT 342 265 219 229   Cardiac Enzymes: No results found for this basename: CKTOTAL, CKMB, CKMBINDEX, TROPONINI,  in the last 168 hours BNP: BNP (last 3 results)  Recent Labs  06/27/12 0440  PROBNP 1763.0*   CBG: No results found for this basename: GLUCAP,  in the last 168 hours     Signed:  [place]  [facility] 06/28/2012, 2:27 PM

## 2012-06-28 NOTE — ED Provider Notes (Signed)
Medical screening examination/treatment/procedure(s) were performed by non-physician practitioner and as supervising physician I was immediately available for consultation/collaboration.   Delora Fuel, MD 88/89/16 [id number]

## 2012-06-28 NOTE — Progress Notes (Signed)
Talked to patient about DCP/ [place] choices offered- patient chose [facility] for HHPT; Lurlean Leyden with [place] called for arrangements. Mindi Slicker RN,BSN,MHA

## 2012-06-28 NOTE — Progress Notes (Signed)
Physical Therapy Treatment Patient Details Name: MIEKO KNEEBONE MRN: [id number] DOB: [birth year] Today's Date: 06/28/2012 Time: 1410-1436 PT Time Calculation (min): 26 min  PT Assessment / Plan / Recommendation Comments on Treatment Session  Pt ambulated in hallway and performed standing static and dynamic balance activities with rest breaks taken as needed.  Pt plans to d/c home with spouse today and agreeable to HHPT to continue improving her balance.    Follow Up Recommendations  Home health PT     Does the patient have the potential to tolerate intense rehabilitation     Barriers to Discharge        Equipment Recommendations  Rolling walker with 5" wheels    Recommendations for Other Services    Frequency     Plan Discharge plan remains appropriate;Frequency remains appropriate    Precautions / Restrictions Precautions Precautions: Fall   Pertinent Vitals/Pain n/a    Mobility  Bed Mobility Bed Mobility: Supine to Sit Supine to Sit: 6: Modified independent (Device/Increase time);HOB elevated Transfers Transfers: Sit to Stand;Stand to Sit Sit to Stand: From bed;5: Supervision Stand to Sit: 5: Supervision;To bed Details for Transfer Assistance: verbal cues for safe technique Ambulation/Gait Ambulation/Gait Assistance: 4: Min guard Ambulation Distance (Feet): 400 Feet Assistive device: Rolling walker Ambulation/Gait Assistance Details: pt appeared more steady however wished to keep RW for stability Gait Pattern: Step-through pattern General Gait Details: 98% SaO2 on room air and ambulated room air however reapplied 2.5 L O2 upon return to supine    Exercises     PT Diagnosis:    PT Problem List:   PT Treatment Interventions:     PT Goals Acute Rehab PT Goals PT Goal: Sit to Stand - Progress: Progressing toward goal PT Goal: Stand to Sit - Progress: Progressing toward goal PT Goal: Ambulate - Progress: Progressing toward goal Additional  Goals Additional Goal #1: Pt will improve balance by increasing SLS time to 12 seconds each LE without UE support. PT Goal: Additional Goal #1 - Progress: Progressing toward goal  Visit Information  Last PT Received On: 06/28/12 Assistance Needed: +1    Subjective Data  Subjective: I don't understand why I can't do this.  My balance is just awful right now.   Cognition  Cognition Arousal/Alertness: Awake/alert Behavior During Therapy: WFL for tasks assessed/performed Overall Cognitive Status: Within Functional Limits for tasks assessed    Balance  Balance Balance Assessed: Yes Static Standing Balance Single Leg Stance - Right Leg: 20 (with 2 HHA, performed multiple times) Single Leg Stance - Left Leg: 20 (with 2 HHA, performed multiple times) Tandem Stance - Right Leg: 30 (with 2 HHA, performed multiple times) Tandem Stance - Left Leg: 30 (with 2 HHA, performed multiple times) High Level Balance High Level Balance Comments: grapevine (with 2 HHA) and tightrope walking 40 feet and then back again 40 feet, did well with grapevine however tightrope walking very challenging for pt (no UE support used)  End of Session PT - End of Session Equipment Utilized During Treatment: Gait belt Activity Tolerance: Patient tolerated treatment well Patient left: in bed;with call bell/phone within reach;with family/visitor present   GP     Joneisha Miles,KATHrine E 06/28/2012, 3:24 PM Carmelia Bake, PT, DPT 06/28/2012 Pager: [phone]

## 2012-06-29 ENCOUNTER — Telehealth: Payer: Self-pay | Admitting: Pulmonary Disease

## 2012-06-29 LAB — CULTURE, RESPIRATORY W GRAM STAIN

## 2012-06-29 NOTE — Telephone Encounter (Signed)
Spoke to pt and her husband. She doesn't have to be seen within a week, she just needed to call us within a week to schedule an appointment. Has been scheduled with SN on 08/07/12 @ 12pm.  Nothing further was needed.

## 2012-07-03 LAB — CULTURE, BLOOD (ROUTINE X 2)

## 2012-07-20 ENCOUNTER — Other Ambulatory Visit: Payer: Self-pay | Admitting: Pulmonary Disease

## 2012-07-20 ENCOUNTER — Encounter: Payer: Self-pay | Admitting: Emergency Medicine

## 2012-07-20 ENCOUNTER — Ambulatory Visit (INDEPENDENT_AMBULATORY_CARE_PROVIDER_SITE_OTHER): Payer: Medicare Other | Admitting: Emergency Medicine

## 2012-07-20 VITALS — BP 152/58 | HR 91 | Temp 97.1°F | Ht 62.5 in | Wt 163.2 lb

## 2012-07-20 DIAGNOSIS — I2789 Other specified pulmonary heart diseases: Secondary | ICD-10-CM

## 2012-07-20 MED ORDER — EZETIMIBE-SIMVASTATIN 10-80 MG PO TABS: 1.0000 | ORAL_TABLET | Freq: Every day | ORAL | Status: DC

## 2012-07-20 NOTE — Assessment & Plan Note (Signed)
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 Still believe we need to drive home the importance of wearing her o2 w exertion. Could consider ramping up COPD regimen, but she actually has no breathing complaints so it is hard to justify doing so. I do not believe she is a good candidate for PAH meds at this time - I would concentrate on the underlying issues. Need to repeat her TTE annually. We will walk her today to try to convince her that she desats despite feeling fine.

## 2012-07-20 NOTE — Progress Notes (Signed)
Patient ID: [name] [name], female DOB: 1951/02/26, 62 y.o. MRN: [id number]   HPI  62 y/o WF, positive tobacco and COPD, anxiety d/o, chronic pain, history of HTN, and hyperlipidemia, and anxiety, chronic reflux. Seen in past by Dr Doreatha Lew 2002 and found to have PASP ~ 45-50 mmHg on TTE. Repeat TTE with estimated PASP 70 mmHg. She does report exposure to dexfenfluromine for about 3 months (was not on fen-phen) in early 2000's (she can't remember when). ANA negative.   ROV 05/27/09 -- PFT's 04/24/09 showed severe obstruction + probable restriction. She never wore her supplimental O2 because she felt better. She cancelled her R heart cath. Continues to take Advair. She has cut her cigarettes down to 1 -2 cigarettes a day. We rechecked autoimmune labs - ANA, aScl70, a-ds-DNA negative. RF borderline positive. She mentions today that she had formerly been adderal, stopped it many mo ago, wonders if this had anything to do with her breathing problems. She feels that her exertional tolerance is good, not sure she needs O2 or a R heart cath.   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 Since last visit, she has decided to undergo R heart cath (7/29) showing mean PAP 41, PAOP 10. She quit smoking in May 2011! Tells me that she is using O2 with some exertion but not all. She doesn't feel very limited by her breathing. She is able to do most activities, but has curtailed some things - does chores but slowly, some SOB with climbing several flights of stairs.   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 PAP 41 with normal wedge. No longer smoking (quit a yr ago). She is using Advair. Rare albuterol use. She is not interested in trying to treat PAH at this time.  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 PAP 41 with normal wedge. Last seen by me in 07/2010.  TTE 4/'14 >> no change in PASP  compared with her R heart cath in 2011.  Note she had diastolic dysfxn and evidence for hypertrophic obstruction. She is taking Advair + rare albuterol prn. She wears o2 prn instead of with all exertion.   She has chronic cough, prod of    Filed Vitals:   07/20/12 1553  BP: 152/58  Pulse: 91  Temp: 97.1 F (36.2 C)  TempSrc: Oral  Height: 5' 2.5" (1.588 m)  Weight: 163 lb 3.2 oz (74.027 kg)  SpO2: 90%   Gen: Pleasant, well-nourished, in no distress,  normal affect  ENT: No lesions,  mouth clear,  oropharynx clear, no postnasal drip  Neck: No JVD, no TMG, no carotid bruits  Lungs: No use of accessory muscles, no dullness to percussion, clear without rales or rhonchi  Cardiovascular: RRR, heart sounds normal, no murmur or gallops, no peripheral edema  Musculoskeletal: No deformities, no cyanosis or clubbing  Neuro: alert, non focal  Skin: Warm, no lesions or rashes     06/27/12 TTE --  Study Conclusions - Left ventricle: The cavity size was normal. There was mild concentric hypertrophy. Systolic function was vigorous. The estimated ejection fraction was in the range of 65% to 70%. There was dynamic obstruction at restin the outflow tract, with a peak velocity of 3.5cm/sec and a peak gradient of 40m Hg. Wall motion was normal; there were no regional wall motion abnormalities. Doppler parameters are consistent with abnormal left ventricular relaxation (grade 1 diastolic  dysfunction). Doppler parameters are consistent with elevated mean left atrial filling pressure. - Mitral valve: Calcified annulus. There was mild systolic anterior motion of the anterior leaflet and chordal structures. - Pulmonary arteries: Systolic pressure was mildly increased. PA peak pressure: 65m Hg (S). 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   HYPERTENSION, PULMONARY Secondary PAH that is  stable by TTE 4/'14 compared w her R heart cath. Still believe we need to drive home the importance of wearing her o2 w exertion. Could consider ramping up COPD regimen, but she actually has no breathing complaints so it is hard to justify doing so. I do not believe she is a good candidate for PAH meds at this time - I would concentrate on the underlying issues. Need to repeat her TTE annually. We will walk her today to try to convince her that she desats despite feeling fine.

## 2012-07-20 NOTE — Patient Instructions (Addendum)
Walking oximetry today showed you need to wear your oxygen 2L w exertion  Please continue your Advair twice a day Use albuterol 2 puffs if needed for shortness of breath.  We will not start any pulmonary hypertension medication at this time. Follow with Dr Lenna Gilford as scheduled Follow with Dr Lamonte Sakai in 12 months or sooner if you have any problems

## 2012-07-25 ENCOUNTER — Telehealth: Payer: Self-pay | Admitting: Pulmonary Disease

## 2012-07-25 NOTE — Telephone Encounter (Signed)
Noted and will forward to SN as an [place]

## 2012-07-27 ENCOUNTER — Ambulatory Visit (INDEPENDENT_AMBULATORY_CARE_PROVIDER_SITE_OTHER): Payer: Medicare Other | Admitting: Adult Health

## 2012-07-27 ENCOUNTER — Encounter: Payer: Self-pay | Admitting: Adult Health

## 2012-07-27 ENCOUNTER — Telehealth: Payer: Self-pay | Admitting: Pulmonary Disease

## 2012-07-27 VITALS — BP 132/66 | HR 62 | Temp 98.1°F | Ht 62.5 in | Wt 157.0 lb

## 2012-07-27 DIAGNOSIS — J209 Acute bronchitis, unspecified: Secondary | ICD-10-CM

## 2012-07-27 DIAGNOSIS — IMO0001 Reserved for inherently not codable concepts without codable children: Secondary | ICD-10-CM

## 2012-07-27 MED ORDER — CEFDINIR 300 MG PO CAPS: 300.0000 mg | ORAL_CAPSULE | Freq: Two times a day (BID) | ORAL | Status: DC

## 2012-07-27 MED ORDER — TRAMADOL HCL 50 MG PO TABS: 50.0000 mg | ORAL_TABLET | Freq: Four times a day (QID) | ORAL | Status: DC | PRN

## 2012-07-27 NOTE — Assessment & Plan Note (Signed)
Mild flare , no longer taking Lorcet   Plan  Trial of Tramadol 66m Three times a day  As needed  Pain.  Stress reducers.

## 2012-07-27 NOTE — Patient Instructions (Addendum)
Wear Oxygen 2.5 L/m with walking and At bedtime  .  Omnicef 355m Twice daily  For 7 days  Mucinex DM Twice daily  As needed  Cough/congestion .  Fluids and rest  Please contact office for sooner follow up if symptoms do not improve or worsen or seek emergency care  follow up Dr. NLenna Gilford As planned and As needed

## 2012-07-27 NOTE — Telephone Encounter (Signed)
Pt has decided to schedule appt with TP for 2:00 today. She wil have O2 discussed then. She needed nothing further

## 2012-07-27 NOTE — Progress Notes (Signed)
Patient ID: [name] [name], female DOB: [birth year], 62 y.o. MRN: [id number]   HPI  62 y/o WF, positive tobacco and COPD, anxiety d/o, chronic pain, history of HTN, and hyperlipidemia, and anxiety, chronic reflux. Seen in past by Dr Doreatha Lew 2002 and found to have PASP ~ 45-50 mmHg on TTE. Repeat TTE with estimated PASP 70 mmHg. She does report exposure to dexfenfluromine for about 3 months (was not on fen-phen) in early 2000's (she can't remember when). ANA negative.   ROV 05/27/09 -- PFT's 04/24/09 showed severe obstruction + probable restriction. She never wore her supplimental O2 because she felt better. She cancelled her R heart cath. Continues to take Advair. She has cut her cigarettes down to 1 -2 cigarettes a day. We rechecked autoimmune labs - ANA, aScl70, a-ds-DNA negative. RF borderline positive. She mentions today that she had formerly been adderal, stopped it many mo ago, wonders if this had anything to do with her breathing problems. She feels that her exertional tolerance is good, not sure she needs O2 or a R heart cath.   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 Since last visit, she has decided to undergo R heart cath (7/29) showing mean PAP 41, PAOP 10. She quit smoking in May 2011! Tells me that she is using O2 with some exertion but not all. She doesn't feel very limited by her breathing. She is able to do most activities, but has curtailed some things - does chores but slowly, some SOB with climbing several flights of stairs.   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 PAP 41 with normal wedge. No longer smoking (quit a yr ago). She is using Advair. Rare albuterol use. She is not interested in trying to treat PAH at this time.  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 PAP 41 with normal wedge. Last seen by me in 07/2010.  TTE 4/'14 >> no change in PASP  compared with her R heart cath in 2011.  Note she had diastolic dysfxn and evidence for hypertrophic obstruction. She is taking Advair + rare albuterol prn. She wears o2 prn instead of with all exertion. >> No changes   07/27/2012 Acute OV Complains of cough with thick mucus, rattling at times.  We discussed O2 use and pt education on wearing O2 with activity. No fever, chest pain, edema, orthopnea, hemoptysis.  Wants portable tanks for when she travels.  Having more FM pain, achy at times in trigger zones esp along back and legs.  LFT nml last month. No rash, ext weakness.  Requests pain meds, no longer taking Lorcet . Stopped couple months ago.   Admitted last month with confusion and COPD flare . Tx w/ abx and steroids . MRI brain w/ chronic changes.   ROS Reviewed and neg except HPI     EXAM :    Gen: Pleasant, well-nourished, in no distress,  normal affect  ENT: No lesions,  mouth clear,  oropharynx clear, no postnasal drip  Neck: No JVD, no TMG, no carotid bruits  Lungs: No use of accessory muscles, no dullness to percussion, clear without rales or rhonchi  Cardiovascular: RRR, heart sounds normal, no murmur or gallops, no peripheral edema  Musculoskeletal: No deformities, no cyanosis or clubbing  Neuro: alert, non focal  Skin: Warm, no lesions or rashes     06/27/12 TTE --  Study Conclusions - Left ventricle:  The cavity size was normal. There was mild concentric hypertrophy. Systolic function was vigorous. The estimated ejection fraction was in the range of 65% to 70%. There was dynamic obstruction at restin the outflow tract, with a peak velocity of 3.5cm/sec and a peak gradient of 41m Hg. Wall motion was normal; there were no regional wall motion abnormalities. Doppler parameters are consistent with abnormal left ventricular relaxation (grade 1 diastolic dysfunction). Doppler parameters are consistent with elevated mean left atrial filling pressure. -  Mitral valve: Calcified annulus. There was mild systolic anterior motion of the anterior leaflet and chordal structures. - Pulmonary arteries: Systolic pressure was mildly increased. PA peak pressure: 437mHg (S). 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

## 2012-07-27 NOTE — Assessment & Plan Note (Signed)
Flare   Plan  Wear Oxygen 2.5 L/m with walking and At bedtime  .  Omnicef 33m Twice daily  For 7 days  Mucinex DM Twice daily  As needed  Cough/congestion .  Fluids and rest  Please contact office for sooner follow up if symptoms do not improve or worsen or seek emergency care  follow up Dr. NLenna Gilford As planned and As needed

## 2012-08-07 ENCOUNTER — Ambulatory Visit (INDEPENDENT_AMBULATORY_CARE_PROVIDER_SITE_OTHER): Payer: Medicare Other | Admitting: Pulmonary Disease

## 2012-08-07 ENCOUNTER — Encounter: Payer: Self-pay | Admitting: Pulmonary Disease

## 2012-08-07 VITALS — BP 126/84 | HR 55 | Temp 98.1°F | Ht 63.0 in | Wt 158.8 lb

## 2012-08-07 DIAGNOSIS — E7800 Pure hypercholesterolemia, unspecified: Secondary | ICD-10-CM

## 2012-08-07 DIAGNOSIS — M1990 Unspecified osteoarthritis, unspecified site: Secondary | ICD-10-CM

## 2012-08-07 DIAGNOSIS — I10 Essential (primary) hypertension: Secondary | ICD-10-CM

## 2012-08-07 DIAGNOSIS — I2789 Other specified pulmonary heart diseases: Secondary | ICD-10-CM

## 2012-08-07 DIAGNOSIS — K219 Gastro-esophageal reflux disease without esophagitis: Secondary | ICD-10-CM

## 2012-08-07 DIAGNOSIS — F411 Generalized anxiety disorder: Secondary | ICD-10-CM

## 2012-08-07 DIAGNOSIS — K589 Irritable bowel syndrome without diarrhea: Secondary | ICD-10-CM

## 2012-08-07 DIAGNOSIS — I421 Obstructive hypertrophic cardiomyopathy: Secondary | ICD-10-CM

## 2012-08-07 DIAGNOSIS — J4489 Other specified chronic obstructive pulmonary disease: Secondary | ICD-10-CM

## 2012-08-07 DIAGNOSIS — G894 Chronic pain syndrome: Secondary | ICD-10-CM

## 2012-08-07 DIAGNOSIS — M5030 Other cervical disc degeneration, unspecified cervical region: Secondary | ICD-10-CM

## 2012-08-07 DIAGNOSIS — J449 Chronic obstructive pulmonary disease, unspecified: Secondary | ICD-10-CM

## 2012-08-07 DIAGNOSIS — IMO0001 Reserved for inherently not codable concepts without codable children: Secondary | ICD-10-CM

## 2012-08-07 NOTE — Patient Instructions (Addendum)
Today we updated your med list in our EPIC system...    Continue your current medications the same...    Continue your Oxygen therapy as prev outlined...  Let's plan a follow up visit in about 4 months w/ FASTING blood work done prior to the visit (call us to set up the lab work prior to that [name] [name])...

## 2012-08-24 ENCOUNTER — Encounter: Payer: Self-pay | Admitting: Pulmonary Disease
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
## 2012-08-25 ENCOUNTER — Other Ambulatory Visit: Payer: Self-pay | Admitting: Pulmonary Disease

## 2012-08-25 MED ORDER — EZETIMIBE-SIMVASTATIN 10-80 MG PO TABS: 1.0000 | ORAL_TABLET | Freq: Every day | ORAL | Status: DC

## 2012-10-09 ENCOUNTER — Ambulatory Visit: Payer: Medicare Other | Admitting: Pulmonary Disease

## 2012-10-12 ENCOUNTER — Ambulatory Visit: Payer: Medicare Other | Admitting: Pulmonary Disease

## 2012-10-13 ENCOUNTER — Ambulatory Visit: Payer: Medicare Other | Admitting: Pulmonary Disease

## 2012-10-23 ENCOUNTER — Ambulatory Visit: Payer: Self-pay | Admitting: Gynecology

## 2012-12-14 ENCOUNTER — Telehealth: Payer: Self-pay | Admitting: Pulmonary Disease

## 2012-12-14 MED ORDER — EZETIMIBE-SIMVASTATIN 10-80 MG PO TABS: 1.0000 | ORAL_TABLET | Freq: Every day | ORAL | Status: DC

## 2012-12-14 NOTE — Telephone Encounter (Signed)
[facility] was [facility] for the pt to be made aware

## 2012-12-20 ENCOUNTER — Other Ambulatory Visit: Payer: Self-pay | Admitting: Pulmonary Disease

## 2012-12-20 ENCOUNTER — Telehealth: Payer: Self-pay | Admitting: Pulmonary Disease

## 2012-12-20 NOTE — Telephone Encounter (Signed)
Change to pharm [facility] in [place]. Needs this called into the pharm today

## 2012-12-20 NOTE — Telephone Encounter (Signed)
lmomtcb x1 for pt whats the name of the medication

## 2012-12-22 MED ORDER — ATENOLOL 25 MG PO TABS: ORAL_TABLET | ORAL | Status: DC

## 2012-12-22 NOTE — Telephone Encounter (Signed)
Pt needs refill on atenolol Pharm [facility] in [place]

## 2012-12-22 NOTE — Telephone Encounter (Signed)
LMTCbx2.

## 2012-12-22 NOTE — Telephone Encounter (Signed)
Refill sent. Evarts Bing, CMA

## 2012-12-25 ENCOUNTER — Other Ambulatory Visit: Payer: Medicare Other

## 2012-12-25 ENCOUNTER — Encounter: Payer: Self-pay | Admitting: Pulmonary Disease

## 2012-12-25 ENCOUNTER — Ambulatory Visit (INDEPENDENT_AMBULATORY_CARE_PROVIDER_SITE_OTHER): Payer: Medicare Other | Admitting: Pulmonary Disease

## 2012-12-25 VITALS — BP 126/62 | HR 54 | Temp 97.4°F | Ht 63.0 in | Wt 166.0 lb

## 2012-12-25 DIAGNOSIS — E7800 Pure hypercholesterolemia, unspecified: Secondary | ICD-10-CM

## 2012-12-25 DIAGNOSIS — I2789 Other specified pulmonary heart diseases: Secondary | ICD-10-CM

## 2012-12-25 DIAGNOSIS — E559 Vitamin D deficiency, unspecified: Secondary | ICD-10-CM

## 2012-12-25 DIAGNOSIS — F329 Major depressive disorder, single episode, unspecified: Secondary | ICD-10-CM | POA: Insufficient documentation

## 2012-12-25 DIAGNOSIS — F32A Depression, unspecified: Secondary | ICD-10-CM | POA: Insufficient documentation

## 2012-12-25 DIAGNOSIS — J449 Chronic obstructive pulmonary disease, unspecified: Secondary | ICD-10-CM

## 2012-12-25 DIAGNOSIS — M1990 Unspecified osteoarthritis, unspecified site: Secondary | ICD-10-CM

## 2012-12-25 DIAGNOSIS — K589 Irritable bowel syndrome without diarrhea: Secondary | ICD-10-CM

## 2012-12-25 DIAGNOSIS — R51 Headache: Secondary | ICD-10-CM

## 2012-12-25 DIAGNOSIS — R7309 Other abnormal glucose: Secondary | ICD-10-CM

## 2012-12-25 DIAGNOSIS — G894 Chronic pain syndrome: Secondary | ICD-10-CM

## 2012-12-25 DIAGNOSIS — Z23 Encounter for immunization: Secondary | ICD-10-CM

## 2012-12-25 DIAGNOSIS — I10 Essential (primary) hypertension: Secondary | ICD-10-CM

## 2012-12-25 DIAGNOSIS — J4489 Other specified chronic obstructive pulmonary disease: Secondary | ICD-10-CM

## 2012-12-25 DIAGNOSIS — F411 Generalized anxiety disorder: Secondary | ICD-10-CM

## 2012-12-25 DIAGNOSIS — IMO0001 Reserved for inherently not codable concepts without codable children: Secondary | ICD-10-CM

## 2012-12-25 DIAGNOSIS — M5030 Other cervical disc degeneration, unspecified cervical region: Secondary | ICD-10-CM

## 2012-12-25 DIAGNOSIS — I421 Obstructive hypertrophic cardiomyopathy: Secondary | ICD-10-CM

## 2012-12-25 DIAGNOSIS — K219 Gastro-esophageal reflux disease without esophagitis: Secondary | ICD-10-CM

## 2012-12-25 MED ORDER — FLUTICASONE-SALMETEROL 250-50 MCG/DOSE IN AEPB: 1.0000 | INHALATION_SPRAY | Freq: Two times a day (BID) | RESPIRATORY_TRACT | Status: DC

## 2012-12-25 MED ORDER — ALBUTEROL SULFATE HFA 108 (90 BASE) MCG/ACT IN AERS: 2.0000 | INHALATION_SPRAY | RESPIRATORY_TRACT | Status: DC | PRN

## 2012-12-25 MED ORDER — ATENOLOL 25 MG PO TABS: ORAL_TABLET | ORAL | Status: DC

## 2012-12-25 MED ORDER — EZETIMIBE-SIMVASTATIN 10-80 MG PO TABS: 1.0000 | ORAL_TABLET | Freq: Every day | ORAL | Status: DC

## 2012-12-25 MED ORDER — HYDROCODONE-ACETAMINOPHEN 5-325 MG PO TABS: 1.0000 | ORAL_TABLET | Freq: Three times a day (TID) | ORAL | Status: DC | PRN
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
## 2012-12-25 NOTE — Patient Instructions (Signed)
Today we updated your med list in our EPIC system...    Continue your current medications the same...    We refilled your meds as requested...  Today we gave you a nebulizer treatment w/ Albuterol & collected a sputum sample for culture... We will arrange for a home nebulizer from [facility] w/ Albuterol 2.30m Tid...    If we recover a pathogen, we will treat you w/ an appropriate antibiotic...  Be sure to take the OTC MUCINEX 6074m 2tabs twice daily w/ lots of water...  Please return to our lab later this week for your FASTING blood work now due...  Call for any questions...  Let's plan a follow up visit in 71m48moooner if needed for problems...Marland KitchenMarland Kitchen

## 2012-12-27 ENCOUNTER — Ambulatory Visit (INDEPENDENT_AMBULATORY_CARE_PROVIDER_SITE_OTHER): Payer: Medicare Other | Admitting: Gynecology

## 2012-12-27 ENCOUNTER — Encounter: Payer: Self-pay | Admitting: Gynecology

## 2012-12-27 ENCOUNTER — Other Ambulatory Visit (HOSPITAL_COMMUNITY)
Admission: RE | Admit: 2012-12-27 | Discharge: 2012-12-27 | Disposition: A | Discharge status: Home / Self Care | Payer: Medicare Other | Source: Ambulatory Visit | Attending: Gynecology | Admitting: Gynecology

## 2012-12-27 VITALS — BP 124/76 | Ht 64.0 in | Wt 165.0 lb

## 2012-12-27 DIAGNOSIS — Z1272 Encounter for screening for malignant neoplasm of vagina: Secondary | ICD-10-CM

## 2012-12-27 DIAGNOSIS — Z01419 Encounter for gynecological examination (general) (routine) without abnormal findings: Secondary | ICD-10-CM | POA: Insufficient documentation

## 2012-12-27 DIAGNOSIS — N951 Menopausal and female climacteric states: Secondary | ICD-10-CM

## 2012-12-27 DIAGNOSIS — N952 Postmenopausal atrophic vaginitis: Secondary | ICD-10-CM

## 2012-12-27 NOTE — Patient Instructions (Addendum)
Obtain your records from Dr. Gerald Leitz office so that I can review them and see if any further testing needs to be ordered.    Call to Schedule your mammogram  Facilities in [place]: 1)  [facility], [place] Phone: [phone] 2)  [facility]. [address] [place] Phone: [phone] 3)  Dr. Isaiah Blakes at [facility] [address] Phone: [phone]     Mammogram A mammogram is an X-ray test to find changes in a woman's breast. You should get a mammogram if:  You are 64 years of age or older  You have risk factors.   Your doctor recommends that you have one.  BEFORE THE TEST  Do not schedule the test the week before your period, especially if your breasts are sore during this time.  On the day of your mammogram:  Wash your breasts and armpits well. After washing, do not put on any deodorant or talcum powder on until after your test.   Eat and drink as you usually do.   Take your medicines as usual.   If you are diabetic and take insulin, make sure you:   Eat before coming for your test.   Take your insulin as usual.   If you cannot keep your appointment, call before the appointment to cancel. Schedule another appointment.  TEST  You will need to undress from the waist up. You will put on a hospital gown.   Your breast will be put on the mammogram machine, and it will press firmly on your breast with a piece of plastic called a compression paddle. This will make your breast flatter so that the machine can X-ray all parts of your breast.   Both breasts will be X-rayed. Each breast will be X-rayed from above and from the side. An X-ray might need to be taken again if the picture is not good enough.   The mammogram will last about 15 to 30 minutes.  AFTER THE TEST Finding out the results of your test Ask when your test results will be ready. Make sure you get  your test results.  Document Released: 05/21/2008 Document Revised: 02/11/2011 Document Reviewed: 05/21/2008 [facility] Patient Information 2012 [place].

## 2012-12-27 NOTE — Progress Notes (Signed)
[name] [name] May 22, 1950 [id number]        62 y.o.  [id number] new patient for followup exam.  Former patient of Dr. Ashley Akin although hasn't seen him in several years.  Past medical history,surgical history, medications, allergies, family history and social history were all reviewed and documented in the EPIC chart.  ROS:  Performed and pertinent positives and negatives are included in the history, assessment and plan .  Exam: Kim assistant Filed Vitals:   12/27/12 1526  BP: 124/76  Height: _0  (1.626 m)  Weight: 165 lb (74.844 kg)   General appearance  Normal Skin grossly normal Head/Neck normal with no cervical or supraclavicular adenopathy thyroid normal Lungs  clear Cardiac RR, without RMG Abdominal  soft, nontender, without masses, organomegaly or hernia Breasts  examined lying and sitting without masses, retractions, discharge or axillary adenopathy. Pelvic  Ext/BUS/vagina  normal with atrophic changes   Adnexa  Without masses or tenderness    Anus and perineum  normal   Rectovaginal  normal sphincter tone without palpated masses or tenderness.    Assessment/Plan:  62 y.o. G21P2002 female for followup exam.   1. Status post TVH historically. Has documented in chart from elsewhere vesicovaginal fistula repair with [facility]. Patient denies ever having this done. Is not having any issues with her bladder. Will obtain records from Dr. Gerald Leitz office to hopefully clarify. The patient is not a good historian. 2. Menopausal symptoms/atrophic vaginal changes. Patient is having some hot flashes and sweats. Overall though she is doing well without significant vaginal dryness. Is not sexually active. Options for management up to and including HRT discussed and she is not interested and would prefer just to monitor. 3. Pap smear. 62 reports Pap smear done by Dr. Harriett Rush in 2012. I have no records and due to her poor history will obtain Pap smear of the vaginal  cuff. 4. Mammography overdue and patient agrees to schedule. SBE monthly reviewed. 5. Colonoscopy. Patient remembers having a colonoscopy but cannot tell me when. Again I will try to obtain prior records I also asked her to look to see if she can figure out when this was done and when it needs to be repeated. 6. DEXA. Patient is unsure if she's ever had a bone density but thinks that she did. Again I will obtain Dr. Gerald Leitz records and see if this is documented. 7. Health maintenance. No lab work done as it is reportedly done through her other 62 offices who follow her for her multiple issues. Patient does agree to obtain records from Dr. Gerald Leitz office and bring them to [place] for [place] to review.  Note: This document was prepared with digital dictation and possible smart phrase technology. Any transcriptional errors that result from this process are unintentional.   Anastasio Auerbach MD, 4:49 PM 12/27/2012

## 2012-12-28 ENCOUNTER — Telehealth: Payer: Self-pay | Admitting: Pulmonary Disease

## 2012-12-28 LAB — URINALYSIS W MICROSCOPIC + REFLEX CULTURE
Bacteria, UA: NONE SEEN
Bilirubin Urine: NEGATIVE
Casts: NONE SEEN
Glucose, UA: NEGATIVE mg/dL
Hgb urine dipstick: NEGATIVE
Ketones, ur: NEGATIVE mg/dL
Leukocytes, UA: NEGATIVE
Protein, ur: NEGATIVE mg/dL
Squamous Epithelial / LPF: NONE SEEN
Urobilinogen, UA: 0.2 mg/dL (ref 0.0–1.0)
pH: 6 (ref 5.0–8.0)

## 2012-12-28 MED ORDER — ALBUTEROL SULFATE (2.5 MG/3ML) 0.083% IN NEBU: 2.5000 mg | INHALATION_SOLUTION | Freq: Three times a day (TID) | RESPIRATORY_TRACT | Status: DC

## 2012-12-28 NOTE — Telephone Encounter (Signed)
rx has been faxed to [facility].  Nothing further is needed.

## 2012-12-28 NOTE — Telephone Encounter (Signed)
RX printed and placed on SN cart for signature. Once done will be faxed to [facility] pharmacy.

## 2012-12-29 ENCOUNTER — Other Ambulatory Visit (INDEPENDENT_AMBULATORY_CARE_PROVIDER_SITE_OTHER): Payer: Medicare Other

## 2012-12-29 ENCOUNTER — Telehealth: Payer: Self-pay | Admitting: Pulmonary Disease

## 2012-12-29 ENCOUNTER — Other Ambulatory Visit: Payer: Self-pay | Admitting: Pulmonary Disease

## 2012-12-29 DIAGNOSIS — R7309 Other abnormal glucose: Secondary | ICD-10-CM

## 2012-12-29 DIAGNOSIS — I10 Essential (primary) hypertension: Secondary | ICD-10-CM

## 2012-12-29 DIAGNOSIS — E7800 Pure hypercholesterolemia, unspecified: Secondary | ICD-10-CM

## 2012-12-29 DIAGNOSIS — K589 Irritable bowel syndrome without diarrhea: Secondary | ICD-10-CM

## 2012-12-29 DIAGNOSIS — E559 Vitamin D deficiency, unspecified: Secondary | ICD-10-CM

## 2012-12-29 DIAGNOSIS — F411 Generalized anxiety disorder: Secondary | ICD-10-CM

## 2012-12-29 LAB — BASIC METABOLIC PANEL
BUN: 13 mg/dL (ref 6–23)
CO2: 38 mEq/L — ABNORMAL HIGH (ref 19–32)
Chloride: 99 mEq/L (ref 96–112)
Creatinine, Ser: 0.7 mg/dL (ref 0.4–1.2)
Potassium: 4.7 mEq/L (ref 3.5–5.1)

## 2012-12-29 LAB — CBC WITH DIFFERENTIAL/PLATELET
Basophils Absolute: 0 10*3/uL (ref 0.0–0.1)
Hemoglobin: 12.8 g/dL (ref 12.0–15.0)
Lymphocytes Relative: 20.6 % (ref 12.0–46.0)
MCHC: 32.9 g/dL (ref 30.0–36.0)
MCV: 89.8 fl (ref 78.0–100.0)
Monocytes Absolute: 0.5 10*3/uL (ref 0.1–1.0)
Monocytes Relative: 8.9 % (ref 3.0–12.0)
Neutro Abs: 3.7 10*3/uL (ref 1.4–7.7)
Neutrophils Relative %: 67.7 % (ref 43.0–77.0)
Platelets: 159 10*3/uL (ref 150.0–400.0)
RDW: 14.3 % (ref 11.5–14.6)

## 2012-12-29 LAB — RESPIRATORY CULTURE OR RESPIRATORY AND SPUTUM CULTURE: Gram Stain: NONE SEEN

## 2012-12-29 LAB — HEPATIC FUNCTION PANEL
Albumin: 4 g/dL (ref 3.5–5.2)
Alkaline Phosphatase: 59 U/L (ref 39–117)
Total Protein: 7 g/dL (ref 6.0–8.3)

## 2012-12-29 LAB — HEMOGLOBIN A1C: Hgb A1c MFr Bld: 6.2 % (ref 4.6–6.5)

## 2012-12-29 LAB — LIPID PANEL
Cholesterol: 139 mg/dL (ref 0–200)
LDL Cholesterol: 62 mg/dL (ref 0–99)
Total CHOL/HDL Ratio: 3
Triglycerides: 149 mg/dL (ref 0.0–149.0)

## 2012-12-29 LAB — TSH: TSH: 1.91 u[IU]/mL (ref 0.35–5.50)

## 2012-12-29 MED ORDER — CIPROFLOXACIN HCL 500 MG PO TABS: 500.0000 mg | ORAL_TABLET | Freq: Two times a day (BID) | ORAL | Status: DC

## 2012-12-29 NOTE — Telephone Encounter (Signed)
Pt calling back again about the inhaler [phone]

## 2012-12-29 NOTE — Telephone Encounter (Signed)
Called and spoke with pt and she is aware that SN is out of the office and will not be back until Monday.  Pt voiced her understanding.

## 2012-12-29 NOTE — Telephone Encounter (Signed)
Called an spoke with pt and she stated that the advair and the vytorin are so expensive.    She wanted to know about changing to the lipitor and the breo.  Pt wanted to see if we could find medications that are going to be cheaper for her.  SN please advise. Thanks  No Known Allergies

## 2012-12-30 LAB — VITAMIN D 25 HYDROXY (VIT D DEFICIENCY, FRACTURES): Vit D, 25-Hydroxy: 21 ng/mL — ABNORMAL LOW (ref 30–89)

## 2013-01-01 ENCOUNTER — Telehealth: Payer: Self-pay | Admitting: Pulmonary Disease

## 2013-01-01 NOTE — Telephone Encounter (Signed)
I spoke with pt and is aware. She will make sure this is covered before we send in anything. Will sign off message. Nothing further needed

## 2013-01-01 NOTE — Telephone Encounter (Signed)
Per SN---  Change vytorin to atorvastatin 40 mg  1 daily Change advair to qvar 80-  #1   2 sprays bid breo is not cheaper than advair  Pt will need to get a copy of her drug formulary to make sure that these medications are covered.

## 2013-01-02 NOTE — Telephone Encounter (Signed)
Per SN---  Change vytorin to atorvastatin 40 mg 1 daily  Change advair to qvar 80- #1 2 sprays bid  breo is not cheaper than advair  Pt will need to get a copy of her drug formulary to make sure that these medications are covered.  The pt was calling to get the names of the above medications again so she can check on her formulary about coverage. Pt states she will call back today. [place] Bing, CMA

## 2013-01-05 NOTE — Telephone Encounter (Signed)
Pt is still working on this and will call once she has info. [place] Bing, CMA

## 2013-01-12 ENCOUNTER — Telehealth: Payer: Self-pay | Admitting: Pulmonary Disease

## 2013-01-12 MED ORDER — BECLOMETHASONE DIPROPIONATE 80 MCG/ACT IN AERS: 2.0000 | INHALATION_SPRAY | Freq: Two times a day (BID) | RESPIRATORY_TRACT | Status: DC

## 2013-01-12 MED ORDER — ATORVASTATIN CALCIUM 40 MG PO TABS: 40.0000 mg | ORAL_TABLET | Freq: Every day | ORAL | Status: DC

## 2013-01-12 NOTE — Telephone Encounter (Signed)
Called and spoke with pt and she is aware that the change has been made in her medication---changed the advair to qvar and the vytorin to atorvastatin. Pt requested that these be sent to [facility] and she will pick these up on the way home to [place].  Nothing further is needed.

## 2013-01-15 ENCOUNTER — Telehealth: Payer: Self-pay | Admitting: Pulmonary Disease

## 2013-01-15 NOTE — Telephone Encounter (Signed)
LMTCBx1.  Bing, CMA

## 2013-01-15 NOTE — Telephone Encounter (Signed)
Pt just wanted to let us know that she is currently in [place], [place]. She has not had to get an rx's filled but wanted to let us know that she will be using [facility] if need be.

## 2013-01-16 ENCOUNTER — Telehealth: Payer: Self-pay | Admitting: Pulmonary Disease

## 2013-01-16 MED ORDER — ATORVASTATIN CALCIUM 40 MG PO TABS: 40.0000 mg | ORAL_TABLET | Freq: Every day | ORAL | Status: DC

## 2013-01-16 NOTE — Telephone Encounter (Signed)
Rx has been sent in. Pt is aware. 

## 2013-01-17 ENCOUNTER — Telehealth: Payer: Self-pay | Admitting: Pulmonary Disease

## 2013-01-17 MED ORDER — ATENOLOL 25 MG PO TABS: ORAL_TABLET | ORAL | Status: DC

## 2013-01-17 NOTE — Telephone Encounter (Signed)
Spoke with patient-states that 90 day Rx was printed on 12-25-12 was only filled for 30 days at local [facility]; I have sent new Rx to [facility]. Pt is aware and nothing more needed at this time.

## 2013-01-22 ENCOUNTER — Other Ambulatory Visit: Payer: Self-pay | Admitting: *Deleted

## 2013-01-22 ENCOUNTER — Telehealth: Payer: Self-pay | Admitting: Pulmonary Disease

## 2013-01-22 MED ORDER — OMEPRAZOLE 20 MG PO CPDR: 20.0000 mg | DELAYED_RELEASE_CAPSULE | Freq: Two times a day (BID) | ORAL | Status: DC

## 2013-01-22 MED ORDER — ATENOLOL 25 MG PO TABS: ORAL_TABLET | ORAL | Status: DC

## 2013-01-22 NOTE — Telephone Encounter (Signed)
Rx has been sent in. Pt is aware. 

## 2013-01-24 ENCOUNTER — Telehealth: Payer: Self-pay | Admitting: Pulmonary Disease

## 2013-01-24 MED ORDER — ALBUTEROL SULFATE (2.5 MG/3ML) 0.083% IN NEBU: 2.5000 mg | INHALATION_SOLUTION | Freq: Three times a day (TID) | RESPIRATORY_TRACT | Status: DC

## 2013-01-24 NOTE — Telephone Encounter (Signed)
I spoke with pt. She is wanting her albuterol neb to go through [facility] since this on the $4 list. I advised will do so. Nothing further needed

## 2013-01-26 ENCOUNTER — Telehealth: Payer: Self-pay | Admitting: Pulmonary Disease

## 2013-01-26 NOTE — Telephone Encounter (Signed)
Order was placed for the nebulizer on 10/20.  i sent a message to [facility] with [facility] to see if the order was received or if we need to put another order in today.

## 2013-01-26 NOTE — Telephone Encounter (Signed)
Melissa stated that the the delivery team had left messages for the pt and they are scheduled to deliver this today to the pt between 12 and 2.  Pt is aware.

## 2013-02-06 ENCOUNTER — Telehealth: Payer: Self-pay | Admitting: Pulmonary Disease

## 2013-02-06 DIAGNOSIS — J9620 Acute and chronic respiratory failure, unspecified whether with hypoxia or hypercapnia: Secondary | ICD-10-CM

## 2013-02-06 NOTE — Telephone Encounter (Signed)
Called and spoke with pts husband and he stated that they just returned from their trip to [place] and the pt is requesting that she be able to keep the portable oxgyen concentrator.  She stated that this is so much more easier to carry around and take into places and she does not get tripped up on this .  Message has been sent to [facility] and order has been placed.

## 2013-02-09 ENCOUNTER — Telehealth: Payer: Self-pay | Admitting: *Deleted

## 2013-02-09 NOTE — Telephone Encounter (Signed)
I think patient would be better served seeing dermatologist to manage long-term use of Retin-A. Unusual product for 62 year old and one that I do not usually prescribe.

## 2013-02-09 NOTE — Telephone Encounter (Signed)
Pt is calling requesting Rx for Retin-A, see on annual 12/27/12. Dr.McPhail originally wrote rx for pt. She would like a 3 month supply and requested Rx be mailed to her to get at a cheaper pharmacy. Please advise

## 2013-02-09 NOTE — Telephone Encounter (Signed)
Informed with the below note.

## 2013-02-19 ENCOUNTER — Telehealth: Payer: Self-pay | Admitting: Pulmonary Disease

## 2013-02-19 MED ORDER — HYDROCODONE-ACETAMINOPHEN 5-325 MG PO TABS: 1.0000 | ORAL_TABLET | Freq: Three times a day (TID) | ORAL | Status: DC | PRN

## 2013-02-19 NOTE — Telephone Encounter (Signed)
rx has been printed out and placed on SN cart to be signed.  Will call pt once this is ready to be picked up.

## 2013-02-19 NOTE — Telephone Encounter (Signed)
Last OV 12/25/12 Pending OV 06/26/13 Last fill 12/25/12 #90 1 TID PRN  SN - please advise on refill. Thanks!

## 2013-02-20 NOTE — Telephone Encounter (Signed)
Called and spoke with pt and she is aware that rx is up front and ready to be picked up.

## 2013-02-27 ENCOUNTER — Telehealth: Payer: Self-pay | Admitting: Pulmonary Disease

## 2013-02-27 MED ORDER — AMOXICILLIN-POT CLAVULANATE 875-125 MG PO TABS: 1.0000 | ORAL_TABLET | Freq: Two times a day (BID) | ORAL | Status: DC

## 2013-02-27 NOTE — Telephone Encounter (Signed)
lmtcb x1 

## 2013-02-27 NOTE — Telephone Encounter (Signed)
I called and spoke with pt. She c/o prod cough w/ green-brown phlem, nasal congestion, sinus HA, PND. She has been taking mucinex daily. Pt requesting recs. Please advise SN thanks Last OV 12/25/12 Pend 06/26/12 No Known Allergies

## 2013-02-27 NOTE — Telephone Encounter (Signed)
Per SN--  augmentin 875 mg  #14  1 po bid mucinex 600 mg  2 po bid Fluids Align once daily

## 2013-02-27 NOTE — Telephone Encounter (Signed)
Pt is aware of SN recs. Rx has been sent in. 

## 2013-03-29 ENCOUNTER — Telehealth: Payer: Self-pay | Admitting: Pulmonary Disease

## 2013-03-29 NOTE — Telephone Encounter (Signed)
Pt decided not to leave a message.  Holly D Pryor ° °

## 2013-03-29 NOTE — Telephone Encounter (Signed)
Pt called back and stated the name of the medication is called belviq. She saw this on TV and wants to know if SN would allow her to try this. Per pt this goes "to the brain" and t"ells the that it is not hungry" so she does not eat. Please advise SN thanks  No Known Allergies

## 2013-03-29 NOTE — Telephone Encounter (Signed)
I called and spoke with pt. She is going to call me back in regards to a name of a medication she is wanting to try to help her lose weight. Will await call back

## 2013-03-30 NOTE — Telephone Encounter (Signed)
Per SN---  SN does not write for this medication.  No diet pills.  thanks

## 2013-03-30 NOTE — Telephone Encounter (Signed)
I called and spoke with pt. She is aware. Nothing further needed

## 2013-04-16 ENCOUNTER — Telehealth: Payer: Self-pay | Admitting: Pulmonary Disease

## 2013-04-16 MED ORDER — HYDROCODONE-ACETAMINOPHEN 5-325 MG PO TABS: 1.0000 | ORAL_TABLET | Freq: Three times a day (TID) | ORAL | Status: DC | PRN

## 2013-04-16 NOTE — Telephone Encounter (Signed)
rx has been signed by SN and placed up front to be picked up.  Pt is aware.

## 2013-04-16 NOTE — Telephone Encounter (Signed)
Hydrocodone last refilled on 02/19/2014.   rx has been printed out and placed on SN cart to be signed.  Will call pt once this has been completed

## 2013-05-10 ENCOUNTER — Telehealth: Payer: Self-pay | Admitting: Pulmonary Disease

## 2013-05-10 MED ORDER — LEVOFLOXACIN 500 MG PO TABS: 500.0000 mg | ORAL_TABLET | Freq: Every day | ORAL | Status: DC

## 2013-05-10 MED ORDER — METHYLPREDNISOLONE 4 MG PO KIT: PACK | ORAL | Status: DC

## 2013-05-10 NOTE — Telephone Encounter (Signed)
Spoke with pt. Reports cough x3 days. Mucus is thick and green in color. SOB and wheezing are present. Has tried Mucinex with minimal relief. [place] so she will not be able to come and get a prescription if needed. If anything is called in has to go to [place] in [place].  No Known Allergies  SN - please advise. Thanks.

## 2013-05-10 NOTE — Telephone Encounter (Signed)
Pt is aware of SN's recs. Both rx's have been sent in. Nothing further is needed.

## 2013-05-10 NOTE — Telephone Encounter (Signed)
Per SN---  levaquin 500 mg  #10  1 po daily Align once daily mucinex 600 mg  2 po bid Fluids Delsym 2 tsp bid Medrol dosepak as directed

## 2013-05-16 ENCOUNTER — Other Ambulatory Visit: Payer: Self-pay | Admitting: Pulmonary Disease

## 2013-05-16 DIAGNOSIS — F411 Generalized anxiety disorder: Secondary | ICD-10-CM

## 2013-05-16 DIAGNOSIS — E559 Vitamin D deficiency, unspecified: Secondary | ICD-10-CM

## 2013-05-30 ENCOUNTER — Telehealth: Payer: Self-pay | Admitting: Pulmonary Disease

## 2013-05-30 DIAGNOSIS — I2789 Other specified pulmonary heart diseases: Secondary | ICD-10-CM

## 2013-05-30 NOTE — Telephone Encounter (Signed)
lmomtcb x 1

## 2013-05-31 MED ORDER — HYDROCODONE-ACETAMINOPHEN 5-325 MG PO TABS: 1.0000 | ORAL_TABLET | Freq: Three times a day (TID) | ORAL | Status: DC | PRN

## 2013-05-31 NOTE — Telephone Encounter (Signed)
Per SN: okay to refill the Hydrocodone #90 and okay to change portable O2 to the Helios system.  Called spoke with patient and advised of the above.  Pt stated she will be by the office tomorrow to pick up her rx.  Order placed to [facility] for the change to [place].  Rx printed for SN to sign w/ envelope on SN's cart.

## 2013-05-31 NOTE — Telephone Encounter (Signed)
Pt requesting refill on Hydrocodone - Last filled on 04/16/13 for # 90 tablets Pt currently has oxygen concentrator at home and portable concentrator for exertion.  Pt wants to switch to Helios system because portable tanks are smaller and lighter.  Please advise if ok to change oxygen .

## 2013-06-01 ENCOUNTER — Telehealth: Payer: Self-pay | Admitting: Pulmonary Disease

## 2013-06-01 DIAGNOSIS — I2789 Other specified pulmonary heart diseases: Secondary | ICD-10-CM

## 2013-06-01 DIAGNOSIS — J449 Chronic obstructive pulmonary disease, unspecified: Secondary | ICD-10-CM

## 2013-06-01 NOTE — Telephone Encounter (Signed)
LMTCB

## 2013-06-04 MED ORDER — ATORVASTATIN CALCIUM 40 MG PO TABS: 40.0000 mg | ORAL_TABLET | Freq: Every day | ORAL | Status: DC

## 2013-06-04 NOTE — Telephone Encounter (Signed)
LMTCBx2 for [place] at [facility]. [place] Bing, CMA

## 2013-06-04 NOTE — Telephone Encounter (Signed)
I spoke with Gwinda Passe and she states that the pt does not qualify for the helios because the requirements are they have to be on 4-6 liters and have dx code has to be pulmonary fibrosis or something like that. I asked what were the options and she states that they can go out and do a portable o2 eval and determine what will work best for the pt. I have placed this order. I advised the pt of this. She then states that she needs a refill on lipitor. Refill sent. [place] Bing, CMA

## 2013-06-20 ENCOUNTER — Ambulatory Visit: Payer: Self-pay | Admitting: Pulmonary Disease

## 2013-06-26 ENCOUNTER — Ambulatory Visit: Payer: Medicare Other | Admitting: Pulmonary Disease

## 2013-06-27 ENCOUNTER — Telehealth: Payer: Self-pay | Admitting: Pulmonary Disease

## 2013-06-27 MED ORDER — AMOXICILLIN-POT CLAVULANATE 875-125 MG PO TABS: 1.0000 | ORAL_TABLET | Freq: Two times a day (BID) | ORAL | Status: DC

## 2013-06-27 MED ORDER — PREDNISONE (PAK) 5 MG PO TABS: ORAL_TABLET | ORAL | Status: DC

## 2013-06-27 NOTE — Telephone Encounter (Signed)
Pt aware of recs. RX called in. Nothing further needed

## 2013-06-27 NOTE — Telephone Encounter (Signed)
Called home # and LMTCB x1 Called cell and spoke with pt. C/o constant blowing nose-green mucus-later on turns yellow, PND, HA, nasal congestion, prod cough w/ green phlem x couple days. Take mucinex and mucinex D, drinking tons of water. Requesting to have something called in. Pt would like a refill on the ABX since [place]. Please advise SN thanks -- [facility] No Known Allergies   Current Outpatient Prescriptions on File Prior to Visit  Medication Sig Dispense Refill  . albuterol (PROAIR HFA) 108 (90 BASE) MCG/ACT inhaler Inhale 2 puffs into the lungs every 4 (four) hours as needed for wheezing.  3 each  3  . albuterol (PROVENTIL) (2.5 MG/3ML) 0.083% nebulizer solution Take 3 mLs (2.5 mg total) by nebulization 3 (three) times daily. DX 493.20  360 mL  12  . ALPRAZolam (XANAX) 1 MG tablet Take 1 tablet by mouth three times daily as needed for nerves per psychiatry      . amoxicillin-clavulanate (AUGMENTIN) 875-125 MG per tablet Take 1 tablet by mouth 2 (two) times daily.  14 tablet  0  . atenolol (TENORMIN) 25 MG tablet TAKE ONE TABLET BY MOUTH EVERY DAY  90 tablet  1  . atorvastatin (LIPITOR) 40 MG tablet Take 1 tablet (40 mg total) by mouth daily.  30 tablet  6  . beclomethasone (QVAR) 80 MCG/ACT inhaler Inhale 2 puffs into the lungs 2 (two) times daily.  1 Inhaler  6  . Cholecalciferol (VITAMIN D) 2000 UNITS CAPS Take 1 capsule by mouth daily.      . ciprofloxacin (CIPRO) 500 MG tablet Take 1 tablet (500 mg total) by mouth 2 (two) times daily.  20 tablet  0  . HYDROcodone-acetaminophen (NORCO/VICODIN) 5-325 MG per tablet Take 1 tablet by mouth 3 (three) times daily as needed for moderate pain.  90 tablet  0  . levofloxacin (LEVAQUIN) 500 MG tablet Take 1 tablet (500 mg total) by mouth daily.  10 tablet  0  . methylPREDNISolone (MEDROL DOSEPAK) 4 MG tablet follow package directions  21 tablet  0  . omeprazole (PRILOSEC) 20 MG capsule Take 1 capsule (20 mg total)  by mouth 2 (two) times daily.  180 capsule  1  . tretinoin (RETIN-A) 0.025 % cream Apply 1 application topically at bedtime. Or as directed       No current facility-administered medications on file prior to visit.

## 2013-06-27 NOTE — Telephone Encounter (Signed)
Per SN---  Call in augmentin 875 mg  #14  1 po bid otc align 1 daily pred dosepak  5 mg  6 day pack.

## 2013-07-02 ENCOUNTER — Ambulatory Visit: Payer: Self-pay | Admitting: Pulmonary Disease

## 2013-07-04 ENCOUNTER — Telehealth: Payer: Self-pay | Admitting: Pulmonary Disease

## 2013-07-04 NOTE — Telephone Encounter (Signed)
Pt is aware that we can't fax this prescription to her. She is requesting that we get the prescription ready and she will pick it up. Knows that she needs a new PCP, still looking for one.  Last OV 12/29/12 Last fill 05/31/13  SN - please advise. Thanks.

## 2013-07-05 MED ORDER — HYDROCODONE-ACETAMINOPHEN 5-325 MG PO TABS: 1.0000 | ORAL_TABLET | Freq: Three times a day (TID) | ORAL | Status: DC | PRN

## 2013-07-05 NOTE — Telephone Encounter (Signed)
Per SN---  Ok for the refill. This has been printed and placed on SN cart to be signed.

## 2013-07-05 NOTE — Telephone Encounter (Signed)
Called and lmom to make the pt aware that the rx for the hydrocodone is up front and ready to be picked up.

## 2013-08-08 ENCOUNTER — Other Ambulatory Visit (INDEPENDENT_AMBULATORY_CARE_PROVIDER_SITE_OTHER): Payer: Medicare HMO

## 2013-08-08 ENCOUNTER — Ambulatory Visit (INDEPENDENT_AMBULATORY_CARE_PROVIDER_SITE_OTHER)
Admission: RE | Admit: 2013-08-08 | Discharge: 2013-08-08 | Disposition: A | Discharge status: Home / Self Care | Payer: Medicare Other | Source: Ambulatory Visit | Attending: Pulmonary Disease | Admitting: Pulmonary Disease

## 2013-08-08 ENCOUNTER — Encounter: Payer: Self-pay | Admitting: Pulmonary Disease

## 2013-08-08 ENCOUNTER — Ambulatory Visit (INDEPENDENT_AMBULATORY_CARE_PROVIDER_SITE_OTHER): Payer: Medicare Other | Admitting: Pulmonary Disease

## 2013-08-08 ENCOUNTER — Encounter (INDEPENDENT_AMBULATORY_CARE_PROVIDER_SITE_OTHER): Payer: Self-pay

## 2013-08-08 VITALS — BP 152/84 | HR 78 | Temp 98.7°F | Ht 63.0 in | Wt 168.6 lb

## 2013-08-08 DIAGNOSIS — J449 Chronic obstructive pulmonary disease, unspecified: Secondary | ICD-10-CM

## 2013-08-08 DIAGNOSIS — I2789 Other specified pulmonary heart diseases: Secondary | ICD-10-CM

## 2013-08-08 DIAGNOSIS — E559 Vitamin D deficiency, unspecified: Secondary | ICD-10-CM

## 2013-08-08 DIAGNOSIS — E7800 Pure hypercholesterolemia, unspecified: Secondary | ICD-10-CM

## 2013-08-08 DIAGNOSIS — K589 Irritable bowel syndrome without diarrhea: Secondary | ICD-10-CM | POA: Diagnosis not present

## 2013-08-08 DIAGNOSIS — J4489 Other specified chronic obstructive pulmonary disease: Secondary | ICD-10-CM

## 2013-08-08 DIAGNOSIS — I10 Essential (primary) hypertension: Secondary | ICD-10-CM

## 2013-08-08 LAB — BASIC METABOLIC PANEL
BUN: 10 mg/dL (ref 6–23)
CO2: 34 mEq/L — ABNORMAL HIGH (ref 19–32)
Calcium: 9.4 mg/dL (ref 8.4–10.5)
Chloride: 96 mEq/L (ref 96–112)
Creatinine, Ser: 0.7 mg/dL (ref 0.4–1.2)
GFR: 89.17 mL/min (ref >60.00)
Glucose, Bld: 102 mg/dL — ABNORMAL HIGH (ref 70–99)
POTASSIUM: 4.2 meq/L (ref 3.5–5.1)
Sodium: 137 mEq/L (ref 135–145)

## 2013-08-08 LAB — CBC WITH DIFFERENTIAL/PLATELET
Basophils Absolute: 0 10*3/uL (ref 0.0–0.1)
Basophils Relative: 0.3 % (ref 0.0–3.0)
EOS PCT: 1.9 % (ref 0.0–5.0)
Eosinophils Absolute: 0.1 10*3/uL (ref 0.0–0.7)
HCT: 39.7 % (ref 36.0–46.0)
Hemoglobin: 13.1 g/dL (ref 12.0–15.0)
Lymphocytes Relative: 28.2 % (ref 12.0–46.0)
Lymphs Abs: 1.9 10*3/uL (ref 0.7–4.0)
MCHC: 33.1 g/dL (ref 30.0–36.0)
MCV: 93.1 fl (ref 78.0–100.0)
MONO ABS: 0.5 10*3/uL (ref 0.1–1.0)
MONOS PCT: 8 % (ref 3.0–12.0)
NEUTROS PCT: 61.6 % (ref 43.0–77.0)
Neutro Abs: 4.2 10*3/uL (ref 1.4–7.7)
Platelets: 193 10*3/uL (ref 150.0–400.0)
RBC: 4.27 Mil/uL (ref 3.87–5.11)
RDW: 13 % (ref 11.5–15.5)
WBC: 6.8 10*3/uL (ref 4.0–10.5)

## 2013-08-08 LAB — HEPATIC FUNCTION PANEL
ALBUMIN: 4.1 g/dL (ref 3.5–5.2)
ALK PHOS: 77 U/L (ref 39–117)
ALT: 18 U/L (ref 0–35)
AST: 23 U/L (ref 0–37)
Bilirubin, Direct: 0.1 mg/dL (ref 0.0–0.3)
Total Bilirubin: 0.8 mg/dL (ref 0.2–1.2)
Total Protein: 7 g/dL (ref 6.0–8.3)

## 2013-08-08 LAB — LIPID PANEL
CHOLESTEROL: 187 mg/dL (ref 0–200)
HDL: 49.9 mg/dL (ref >39.00)
LDL Cholesterol: 94 mg/dL (ref 0–99)
NonHDL: 137.1
TRIGLYCERIDES: 215 mg/dL — AB (ref 0.0–149.0)
Total CHOL/HDL Ratio: 4
VLDL: 43 mg/dL — ABNORMAL HIGH (ref 0.0–40.0)

## 2013-08-08 LAB — VITAMIN D 25 HYDROXY (VIT D DEFICIENCY, FRACTURES): VITD: 37.4 ng/mL

## 2013-08-08 MED ORDER — METOPROLOL TARTRATE 50 MG PO TABS
50.0000 mg | ORAL_TABLET | Freq: Two times a day (BID) | ORAL | Status: DC
Start: 2013-08-08 — End: 2014-04-02

## 2013-08-08 MED ORDER — HYDROCODONE-ACETAMINOPHEN 5-325 MG PO TABS: 1.0000 | ORAL_TABLET | Freq: Three times a day (TID) | ORAL | Status: DC | PRN

## 2013-08-08 MED ORDER — FLUTICASONE-SALMETEROL 250-50 MCG/DOSE IN AEPB: 1.0000 | INHALATION_SPRAY | Freq: Two times a day (BID) | RESPIRATORY_TRACT | Status: DC
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
## 2013-08-08 NOTE — Patient Instructions (Signed)
Today we updated your med list in our EPIC system...    We wrote new prescriptions for your [id number]- take one inhalation twice daily...  We decided to change your Atenolol to METOPROLOL 26m - take one tab twice daily...  Today we did your follow up CXR, PFT, & FASTING blood work...    We will contact you w/ the results when available...   We will [facility] of [facility]...  Call for any questions...  Let's plan a follow up visit in [phone] if needed for problems...Marland KitchenMarland Kitchen

## 2013-08-09 ENCOUNTER — Telehealth: Payer: Self-pay | Admitting: Pulmonary Disease

## 2013-08-09 NOTE — Telephone Encounter (Signed)
Called and spoke with pt and she is aware of results per SN.

## 2013-08-10 ENCOUNTER — Telehealth: Payer: Self-pay | Admitting: Pulmonary Disease

## 2013-08-10 NOTE — Telephone Encounter (Signed)
Called and spoke with pt and she is wanted to know about the testing for the helios oxygen.  i have answered the pts questions and nothing further is needed.   Pt spoke with medicare and they gave the name of medical service of [place] if she qualifies for the helios.

## 2013-08-14 DIAGNOSIS — F332 Major depressive disorder, recurrent severe without psychotic features: Secondary | ICD-10-CM | POA: Diagnosis not present

## 2013-08-24 ENCOUNTER — Telehealth: Payer: Self-pay | Admitting: Pulmonary Disease

## 2013-08-24 NOTE — Telephone Encounter (Signed)
lmomtcb x1

## 2013-08-24 NOTE — Telephone Encounter (Signed)
Per 6.8.15 ov w/ SN: Patient Instructions     Today we updated your med list in our EPIC system...  We wrote new prescriptions for your [id number]- take one inhalation twice daily...  We decided to change your Atenolol to METOPROLOL 71m - take one tab twice daily...  Today we did your follow up CXR, PFT, & FASTING blood work...  We will contact you w/ the results when available...  We will arrange for an app[facility]/ [facility] of your cardiomyopathy...  Call for any questions...  Let's plan a follow up visit in [phone] if needed for problems...   LMTCB TCB x1.

## 2013-08-24 NOTE — Telephone Encounter (Signed)
Pt called back.

## 2013-08-24 NOTE — Telephone Encounter (Signed)
Pt returned call.  Spoke with patient who has yet to fill the Metoprolol rx'd at last ov.  She is asking for some additional information on this medication.  Pt stated she is currently in the process of moving and is not comfortable with the pharmacist at her would-be new pharmacy.  While speaking with pt on the phone, she decided to speak with her regular pharmacist at [facility] since she has some additional questions about another medication prescribed by another physician.  Pt stated "please don't bother Dr Lenna Gilford with this; I just needed to talk about it out loud to decide what I needed to do."  Advised pt will sign off on this note and encouraged her to please contact the office again should she have any additional questions/concerns.  Pt verbalized her understanding.  Nothing further needed; will sign off.

## 2013-09-11 ENCOUNTER — Ambulatory Visit: Payer: Medicare Other | Admitting: Internal Medicine

## 2013-09-13 ENCOUNTER — Telehealth: Payer: Self-pay | Admitting: Pulmonary Disease

## 2013-09-13 DIAGNOSIS — J449 Chronic obstructive pulmonary disease, unspecified: Secondary | ICD-10-CM

## 2013-09-13 NOTE — Telephone Encounter (Signed)
Called and spoke with pt and she stated that she is trying to get a smaller oxygen tank from [facility].  She stated that she has had a hard time getting this from them [place] can use the office in [place], [place].  Pt stated that she will need this order sent in to them so she will be able to get outside and do things.  She stated that she is having a hard time getting out of the house with the concentrator that she has now. She also stated that she has no energy.  Pt stated that she uses the oxygen on 2 liters- 2.5 liters.   SN please advise. Thanks  No Known Allergies  Current Outpatient Prescriptions on File Prior to Visit  Medication Sig Dispense Refill  . albuterol (PROAIR HFA) 108 (90 BASE) MCG/ACT inhaler Inhale 2 puffs into the lungs every 4 (four) hours as needed for wheezing.  3 each  3  . albuterol (PROVENTIL) (2.5 MG/3ML) 0.083% nebulizer solution Take 3 mLs (2.5 mg total) by nebulization 3 (three) times daily. DX 493.20  360 mL  12  . ALPRAZolam (XANAX) 1 MG tablet Take 1 tablet by mouth three times daily as needed for nerves per psychiatry      . atenolol (TENORMIN) 25 MG tablet TAKE ONE TABLET BY MOUTH EVERY DAY  90 tablet  1  . atorvastatin (LIPITOR) 40 MG tablet Take 1 tablet (40 mg total) by mouth daily.  30 tablet  6  . Cholecalciferol (VITAMIN D) 2000 UNITS CAPS Take 1 capsule by mouth daily.      . Fluticasone-Salmeterol (ADVAIR DISKUS) 250-50 MCG/DOSE AEPB Inhale 1 puff into the lungs 2 (two) times daily.  60 each  5  . HYDROcodone-acetaminophen (NORCO/VICODIN) 5-325 MG per tablet Take 1 tablet by mouth 3 (three) times daily as needed for moderate pain.  90 tablet  0  . metoprolol (LOPRESSOR) 50 MG tablet Take 1 tablet (50 mg total) by mouth 2 (two) times daily.  60 tablet  6  . omeprazole (PRILOSEC) 20 MG capsule Take 1 capsule (20 mg total) by mouth 2 (two) times daily.  180 capsule  1  . tretinoin (RETIN-A) 0.025 % cream Apply  1 application topically at bedtime. Or as directed       No current facility-administered medications on file prior to visit.

## 2013-09-14 NOTE — Telephone Encounter (Signed)
Order has been placed and i have sent a message to [facility].

## 2013-09-14 NOTE — Telephone Encounter (Signed)
Per SN---   Ok to send order to [facility] and message to [facility] to let them know of the order that pt is requesting a light weight portable system---  2 liters at rest 3 liters with exercise.    thanks

## 2013-09-20 DIAGNOSIS — F332 Major depressive disorder, recurrent severe without psychotic features: Secondary | ICD-10-CM | POA: Diagnosis not present

## 2013-09-21 ENCOUNTER — Ambulatory Visit (INDEPENDENT_AMBULATORY_CARE_PROVIDER_SITE_OTHER): Payer: Medicare Other | Admitting: Internal Medicine

## 2013-09-21 ENCOUNTER — Encounter: Payer: Self-pay | Admitting: Internal Medicine

## 2013-09-21 VITALS — BP 146/82 | HR 66 | Ht 63.0 in | Wt 161.7 lb

## 2013-09-21 DIAGNOSIS — R0609 Other forms of dyspnea: Secondary | ICD-10-CM

## 2013-09-21 DIAGNOSIS — I10 Essential (primary) hypertension: Secondary | ICD-10-CM

## 2013-09-21 DIAGNOSIS — E7800 Pure hypercholesterolemia, unspecified: Secondary | ICD-10-CM | POA: Diagnosis not present

## 2013-09-21 DIAGNOSIS — I2789 Other specified pulmonary heart diseases: Secondary | ICD-10-CM | POA: Diagnosis not present

## 2013-09-21 DIAGNOSIS — I421 Obstructive hypertrophic cardiomyopathy: Secondary | ICD-10-CM | POA: Diagnosis not present

## 2013-09-21 DIAGNOSIS — I272 Pulmonary hypertension, unspecified: Secondary | ICD-10-CM

## 2013-09-21 DIAGNOSIS — R0989 Other specified symptoms and signs involving the circulatory and respiratory systems: Secondary | ICD-10-CM

## 2013-09-21 NOTE — Progress Notes (Signed)
OFFICE NOTE  Chief Complaint:  Shortness of breath  Primary Care Physician: Noralee Space, MD  HPI:  [name] [name] is a 63 year old female with numerous medical problems including problems with an underlying psychiatric disorder under the care of a psychiatrist and therapist. She does has a history of pulmonary hypertension. She was previously seeing Dr. Lamonte Sakai but reports that they "didn't see eye to eye". She's been on chronic oxygen therapy for some time and gets short of breath when not using it. Pulmonary pressures in the past have been as high as 40-60 mm mercury. She also had an echocardiogram in 2014 which demonstrated a thickened ventricle concerning for hypertrophic cardiomyopathy. There was a peak gradient up to 30 mmHg in the left ventricle. She does report shortness of breath with exertion which has been fairly stable. Again his life she's wearing oxygen she feels her breathing is pretty good. She denies any cardiac chest pain.  PMHx:  Past Medical History  Diagnosis Date  . COPD (chronic obstructive pulmonary disease)   . Hypertension   . Hypercholesteremia   . GERD (gastroesophageal reflux disease)   . IBS (irritable bowel syndrome)   . DJD (degenerative joint disease)   . Cervical disc disease   . Fibromyalgia   . Vitamin D deficiency   . Chronic pain syndrome   . Chronic headache   . Anxiety disorder     Past Surgical History  Procedure Laterality Date  . Vesicovaginal fistula closure w/ tah  2001  . Anterior cervical decomp/discectomy fusion  2002  . Carpal tunnel release      bilateral  . Vaginal hysterectomy  age 40    FAMHx:  Family History  Problem Relation Age of Onset  . Heart disease Mother   . Other Father     asbestos exposure    SOCHx:   reports that she quit smoking about 4 years ago. Her smoking use included Cigarettes. She has a 30 pack-year smoking history. She quit smokeless tobacco use about 4 years ago. She reports that  she does not drink alcohol or use illicit drugs.  ALLERGIES:  No Known Allergies  ROS: A comprehensive review of systems was negative except for: Cardiovascular: positive for dyspnea Behavioral/Psych: positive for unclear mental health disorder  HOME MEDS: Current Outpatient Prescriptions  Medication Sig Dispense Refill  . ADDERALL 15 MG tablet Take 1 tablet by mouth daily as needed.      Marland Kitchen albuterol (PROAIR HFA) 108 (90 BASE) MCG/ACT inhaler Inhale 2 puffs into the lungs every 4 (four) hours as needed for wheezing.  3 each  3  . albuterol (PROVENTIL) (2.5 MG/3ML) 0.083% nebulizer solution Take 3 mLs (2.5 mg total) by nebulization 3 (three) times daily. DX 493.20  360 mL  12  . ALPRAZolam (XANAX) 1 MG tablet Take 1 tablet by mouth three times daily as needed for nerves per psychiatry      . atorvastatin (LIPITOR) 40 MG tablet Take 1 tablet (40 mg total) by mouth daily.  30 tablet  6  . Cholecalciferol (VITAMIN D) 2000 UNITS CAPS Take 2 capsules by mouth daily.       . DULoxetine (CYMBALTA) 30 MG capsule Take 2 capsules by mouth daily.      . Fluticasone-Salmeterol (ADVAIR DISKUS) 250-50 MCG/DOSE AEPB Inhale 1 puff into the lungs 2 (two) times daily.  60 each  5  . HYDROcodone-acetaminophen (NORCO/VICODIN) 5-325 MG per tablet Take 1 tablet by mouth 3 (three) times daily as  needed (minimal pain).      . metoprolol (LOPRESSOR) 50 MG tablet Take 1 tablet (50 mg total) by mouth 2 (two) times daily.  60 tablet  6  . omeprazole (PRILOSEC) 20 MG capsule Take 1 capsule (20 mg total) by mouth 2 (two) times daily.  180 capsule  1  . OXYGEN-HELIUM IN Inhale 2.5 L into the lungs continuous.      Marland Kitchen tretinoin (RETIN-A) 0.025 % cream Apply 1 application topically at bedtime. Or as directed       No current facility-administered medications for this visit.    LABS/IMAGING: No results found for this or any previous visit (from the past 48 hour(s)). No results found.  VITALS: BP 146/82  Pulse 66  Ht  _0  (1.6 m)  Wt 161 lb 11.2 oz (73.347 kg)  BMI 28.65 kg/m2  EXAM: General appearance: alert and no distress Neck: no carotid bruit and no JVD Lungs: clear to auscultation bilaterally Heart: regular rate and rhythm, S1, S2 normal, no murmur, click, rub or gallop Abdomen: soft, non-tender; bowel sounds normal; no masses,  no organomegaly Extremities: extremities normal, atraumatic, no cyanosis or edema Pulses: 2+ and symmetric Skin: Skin color, texture, turgor normal. No rashes or lesions Neurologic: Grossly normal Psych: Pressured speech, flight of ideas  EKG: Normal sinus rhythm at 66  ASSESSMENT: 1. Dyspnea exertion 2. History pulmonary hypertension 3. Chronic oxygen therapy 4. 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   [name] [name] reports chronic shortness of breath which may be a combination due to underlying lung disease, pulmonary hypertension and/or hypertrophic cardiomyopathy in the setting of exertion. I would recommend a repeat echocardiogram to see if there is been any change in intracavitary gradient or worsening outflow tract obstruction. This will also give [place] an opportunity to reassess her pulmonary pressures. She should continue to use oxygen. She's not currently reporting any chest pain and her EKG does not demonstrate any ischemic changes.  Thank you for the referral.  Pixie Casino, MD, [facility] Attending Cardiologist [facility]  Darci Lykins C 09/21/2013, 5:32 PM

## 2013-09-21 NOTE — Patient Instructions (Signed)
Your physician wants you to follow-up in: 1 year with Dr. Debara Pickett. You will receive a reminder letter in the mail two months in advance. If you don't receive a letter, please call our office to schedule the follow-up appointment.  Your physician has requested that you have an echocardiogram. Echocardiography is a painless test that uses sound waves to create images of your heart. It provides your doctor with information about the size and shape of your heart and how well your heart's chambers and valves are working. This procedure takes approximately one hour. There are no restrictions for this procedure.

## 2013-10-03 ENCOUNTER — Telehealth: Payer: Self-pay | Admitting: Pulmonary Disease

## 2013-10-03 NOTE — Telephone Encounter (Signed)
Pt last had refill on norco 08/08/13 Pt has pending appt with new PCP 12/27/13. Please advise SN thanks

## 2013-10-04 MED ORDER — HYDROCODONE-ACETAMINOPHEN 5-325 MG PO TABS: 1.0000 | ORAL_TABLET | Freq: Three times a day (TID) | ORAL | Status: DC | PRN

## 2013-10-04 NOTE — Telephone Encounter (Signed)
rx has been placed up front for the pt to come by and pick this up.  thanks

## 2013-10-04 NOTE — Telephone Encounter (Signed)
lmomtcb x1 

## 2013-10-04 NOTE — Telephone Encounter (Signed)
Pt will be by to pick this up today or tomorrow. Nothing further needed at this time.

## 2013-10-04 NOTE — Telephone Encounter (Signed)
Per SN---  Ok to refill.  This has been printed out and placed on SN cart to sign.  Will call pt once this has been done.

## 2013-10-05 ENCOUNTER — Ambulatory Visit (HOSPITAL_COMMUNITY)
Admission: RE | Admit: 2013-10-05 | Discharge: 2013-10-05 | Disposition: A | Discharge status: Home / Self Care | Payer: Medicare Other | Source: Ambulatory Visit | Attending: Cardiovascular Disease | Admitting: Cardiovascular Disease

## 2013-10-05 DIAGNOSIS — I421 Obstructive hypertrophic cardiomyopathy: Secondary | ICD-10-CM | POA: Insufficient documentation

## 2013-10-05 DIAGNOSIS — I059 Rheumatic mitral valve disease, unspecified: Secondary | ICD-10-CM | POA: Diagnosis not present

## 2013-10-05 DIAGNOSIS — J449 Chronic obstructive pulmonary disease, unspecified: Secondary | ICD-10-CM | POA: Diagnosis not present

## 2013-10-05 DIAGNOSIS — R0609 Other forms of dyspnea: Secondary | ICD-10-CM | POA: Diagnosis not present

## 2013-10-05 DIAGNOSIS — R079 Chest pain, unspecified: Secondary | ICD-10-CM | POA: Diagnosis present

## 2013-10-05 DIAGNOSIS — I422 Other hypertrophic cardiomyopathy: Secondary | ICD-10-CM | POA: Insufficient documentation

## 2013-10-05 DIAGNOSIS — I272 Pulmonary hypertension, unspecified: Secondary | ICD-10-CM

## 2013-10-05 DIAGNOSIS — I2789 Other specified pulmonary heart diseases: Secondary | ICD-10-CM | POA: Insufficient documentation

## 2013-10-05 DIAGNOSIS — J4489 Other specified chronic obstructive pulmonary disease: Secondary | ICD-10-CM | POA: Diagnosis not present

## 2013-10-05 DIAGNOSIS — R0989 Other specified symptoms and signs involving the circulatory and respiratory systems: Secondary | ICD-10-CM | POA: Diagnosis not present

## 2013-10-05 NOTE — Progress Notes (Signed)
2D Echocardiogram Complete.  10/05/2013   Chablis Losh [place], RDCS

## 2013-10-10 ENCOUNTER — Other Ambulatory Visit: Payer: Self-pay | Admitting: *Deleted

## 2013-10-10 DIAGNOSIS — I272 Pulmonary hypertension, unspecified: Secondary | ICD-10-CM

## 2013-11-08 ENCOUNTER — Ambulatory Visit (HOSPITAL_COMMUNITY)
Admission: RE | Admit: 2013-11-08 | Discharge: 2013-11-08 | Disposition: A | Discharge status: Home / Self Care | Payer: Medicare Other | Source: Ambulatory Visit | Attending: Internal Medicine | Admitting: Internal Medicine

## 2013-11-08 ENCOUNTER — Encounter (HOSPITAL_COMMUNITY): Payer: Self-pay

## 2013-11-08 VITALS — BP 160/86 | HR 61 | Wt 161.8 lb

## 2013-11-08 DIAGNOSIS — Z9981 Dependence on supplemental oxygen: Secondary | ICD-10-CM | POA: Insufficient documentation

## 2013-11-08 DIAGNOSIS — F329 Major depressive disorder, single episode, unspecified: Secondary | ICD-10-CM | POA: Insufficient documentation

## 2013-11-08 DIAGNOSIS — F411 Generalized anxiety disorder: Secondary | ICD-10-CM | POA: Diagnosis not present

## 2013-11-08 DIAGNOSIS — J449 Chronic obstructive pulmonary disease, unspecified: Secondary | ICD-10-CM | POA: Diagnosis not present

## 2013-11-08 DIAGNOSIS — M5030 Other cervical disc degeneration, unspecified cervical region: Secondary | ICD-10-CM | POA: Insufficient documentation

## 2013-11-08 DIAGNOSIS — J4489 Other specified chronic obstructive pulmonary disease: Secondary | ICD-10-CM | POA: Insufficient documentation

## 2013-11-08 DIAGNOSIS — I2789 Other specified pulmonary heart diseases: Secondary | ICD-10-CM

## 2013-11-08 DIAGNOSIS — IMO0001 Reserved for inherently not codable concepts without codable children: Secondary | ICD-10-CM | POA: Diagnosis not present

## 2013-11-08 DIAGNOSIS — K219 Gastro-esophageal reflux disease without esophagitis: Secondary | ICD-10-CM | POA: Insufficient documentation

## 2013-11-08 DIAGNOSIS — M1990 Unspecified osteoarthritis, unspecified site: Secondary | ICD-10-CM | POA: Diagnosis not present

## 2013-11-08 DIAGNOSIS — E7800 Pure hypercholesterolemia, unspecified: Secondary | ICD-10-CM | POA: Insufficient documentation

## 2013-11-08 DIAGNOSIS — G894 Chronic pain syndrome: Secondary | ICD-10-CM | POA: Diagnosis not present

## 2013-11-08 DIAGNOSIS — Z87891 Personal history of nicotine dependence: Secondary | ICD-10-CM | POA: Insufficient documentation

## 2013-11-08 DIAGNOSIS — I10 Essential (primary) hypertension: Secondary | ICD-10-CM | POA: Diagnosis not present

## 2013-11-08 DIAGNOSIS — F3289 Other specified depressive episodes: Secondary | ICD-10-CM | POA: Diagnosis not present

## 2013-11-08 DIAGNOSIS — E559 Vitamin D deficiency, unspecified: Secondary | ICD-10-CM | POA: Insufficient documentation

## 2013-11-08 DIAGNOSIS — K589 Irritable bowel syndrome without diarrhea: Secondary | ICD-10-CM | POA: Diagnosis not present

## 2013-11-08 DIAGNOSIS — R0602 Shortness of breath: Secondary | ICD-10-CM | POA: Diagnosis not present

## 2013-11-08 MED ORDER — SPIRONOLACTONE 25 MG PO TABS: 12.5000 mg | ORAL_TABLET | Freq: Every day | ORAL | Status: DC

## 2013-11-08 NOTE — Patient Instructions (Signed)
Start Spironolactone 1/2 tab daily  Labs in 1 week  Follow up as needed

## 2013-11-08 NOTE — Addendum Note (Signed)
Encounter addended by: Scarlette Calico, RN on: 11/08/2013  1:47 PM<BR>     Documentation filed: Patient Instructions Section, Orders

## 2013-11-08 NOTE — Progress Notes (Signed)
Patient ID: [name] [name], female   DOB: 1950-09-29, 63 y.o.   MRN: [id number]    OFFICE NOTE  Chief Complaint:  Shortness of breath  Primary Care Physician: Noralee Space, MD Primary cardiologist; Debara Pickett  HPI:  [name] [name] is a 63 year old female with fibromyalgia, depression, COPD on chronic O2, HTN and secondary PAH. She is referred by Dr. Debara Pickett for further evaluation of pulmonary HTN.   She was previously seeing Dr. Lamonte Sakai but reports that they "didn't see eye to eye". She has had multiple echos with RVSP estimated 40-70. I did RHC on her in 7/11 - see below.  She has severe obstruction on PFTs. Auto-immune test have been normal. She has not had a VQ scan. Smoked 1ppd for a long time but quit around 2010. Had sleeps study 5 years ago - non-diagnostic because she didn't sleep enough.   Spirometry 6/15:  FEV1 0.89 L (38%) FVC 1.9 L FEF 25-75% 0.27 (12%)   RHC 7/11: RA 9 RV 58/4/10 PA 61/27 (41) PCWP 10 Fick CO/CI: 3.0/1.7  Repeat echo 8/15. LVEF normal. + DD with elevated L-sided pressures. RVSP 80. RV normal.   CXR 08/08/13: Hyperinflation compatible with COPD. Lingular scarring. No active disease.   Wears O2 24/7. But says she has a hard time carrying her O2 container. Despite her lung disease, she says she is very active and can go for 60-90 minutes without much difficulty. Wears pulse ox at home and almost always > 90%. No CP. No cough, edema, no syncope/presyncope. + wheezing. Snores some. Eats a lot of ice.    PMHx:  Past Medical History  Diagnosis Date  . COPD (chronic obstructive pulmonary disease)   . Hypertension   . Hypercholesteremia   . GERD (gastroesophageal reflux disease)   . IBS (irritable bowel syndrome)   . DJD (degenerative joint disease)   . Cervical disc disease   . Fibromyalgia   . Vitamin D deficiency   . Chronic pain syndrome   . Chronic headache   . Anxiety disorder     Past Surgical History  Procedure Laterality  Date  . Vesicovaginal fistula closure w/ tah  2001  . Anterior cervical decomp/discectomy fusion  2002  . Carpal tunnel release      bilateral  . Vaginal hysterectomy  age 36    FAMHx:  Family History  Problem Relation Age of Onset  . Heart disease Mother   . Other Father     asbestos exposure    SOCHx:   reports that she quit smoking about 4 years ago. Her smoking use included Cigarettes. She has a 30 pack-year smoking history. She quit smokeless tobacco use about 4 years ago. She reports that she does not drink alcohol or use illicit drugs.  ALLERGIES:  No Known Allergies   HOME MEDS: Current Outpatient Prescriptions  Medication Sig Dispense Refill  . ADDERALL 15 MG tablet Take 1 tablet by mouth daily as needed.      Marland Kitchen albuterol (PROAIR HFA) 108 (90 BASE) MCG/ACT inhaler Inhale 2 puffs into the lungs every 4 (four) hours as needed for wheezing.  3 each  3  . albuterol (PROVENTIL) (2.5 MG/3ML) 0.083% nebulizer solution Take 3 mLs (2.5 mg total) by nebulization 3 (three) times daily. DX 493.20  360 mL  12  . ALPRAZolam (XANAX) 1 MG tablet Take 1 tablet by mouth three times daily as needed for nerves per psychiatry      . atorvastatin (LIPITOR) 40 MG tablet Take  1 tablet (40 mg total) by mouth daily.  30 tablet  6  . Cholecalciferol (VITAMIN D) 2000 UNITS CAPS Take 2 capsules by mouth daily.       . DULoxetine (CYMBALTA) 30 MG capsule Take 2 capsules by mouth daily.      . Fluticasone-Salmeterol (ADVAIR DISKUS) 250-50 MCG/DOSE AEPB Inhale 1 puff into the lungs 2 (two) times daily.  60 each  5  . HYDROcodone-acetaminophen (NORCO/VICODIN) 5-325 MG per tablet Take 1 tablet by mouth 3 (three) times daily as needed (minimal pain).  90 tablet  0  . metoprolol (LOPRESSOR) 50 MG tablet Take 1 tablet (50 mg total) by mouth 2 (two) times daily.  60 tablet  6  . omeprazole (PRILOSEC) 20 MG capsule Take 1 capsule (20 mg total) by mouth 2 (two) times daily.  180 capsule  1  . OXYGEN-HELIUM IN  Inhale 2.5 L into the lungs continuous.      Marland Kitchen tretinoin (RETIN-A) 0.025 % cream Apply 1 application topically at bedtime. Or as directed       No current facility-administered medications for this encounter.    LABS/IMAGING: No results found for this or any previous visit (from the past 48 hour(s)). No results found.  VITALS: BP 160/86  Pulse 61  Wt 161 lb 12 oz (73.369 kg)  SpO2 91%  EXAM: General:  Sitting on exam table. Wearing O2 No resp difficulty HEENT: normal Neck: supple. no JVD. Carotids 2+ bilat; no bruits. No lymphadenopathy or thryomegaly appreciated. Cor: PMI nondisplaced. Regular rate & rhythm. 2/6 SEM over LVOT. +s4. P2 not increased significantly. No RV lift Lungs: markedly decrease BS throughout, no wheeze  Abdomen: obses soft, nontender, nondistended. No hepatosplenomegaly. No bruits or masses. Good bowel sounds. Extremities: no cyanosis, clubbing, rash, edema Neuro: alert & orientedx3, cranial nerves grossly intact. moves all 4 extremities w/o difficulty. Affect pleasant   EKG: (09/21/13) Normal sinus rhythm at 66. TWI in V1v2. No RAE/RVH  ASSESSMENT: 1. Chronic respiratory failure on home O2 2. End-stage COPD 3. Secondary PAH 4.   Diastolic dysfunction on echo with mild LVOT gradient  PLAN:  I have reviewed all her records and previous studies. She does indeed have moderate to severe PAH. However, I suspect this is secondary due to her severe underlying disease. Unfortunately pulmonary vasodilators will likely not help her and run a significant risk of worsening her hypoxemia. I think the best we can do is to make sure she is adequately oxygenated at all times and also help keep her lungs dry by treating her diastolic dysfucntion. With her HTN will add spironolactone 12.5 mg daily. Will need BMET in 1 week.  She has not had a w/u for chronic VTE/PE but I think the likelihood of this is low.   We did discuss possible referral for lung transplantation  evaluation but at this point I feel her functional capacity is probably too good to warrant this. She is living in [place] and not interested in Pulmonary Rehab.   We can see her back on a PRN basis.   Total time spent 45 minutes. Over half that time spent discussing above.    Glori Bickers MD 11/08/2013, 12:54 PM

## 2013-11-09 ENCOUNTER — Telehealth: Payer: Self-pay | Admitting: Pulmonary Disease

## 2013-11-09 DIAGNOSIS — F332 Major depressive disorder, recurrent severe without psychotic features: Secondary | ICD-10-CM | POA: Diagnosis not present

## 2013-11-09 MED ORDER — HYDROCODONE-ACETAMINOPHEN 5-325 MG PO TABS: 1.0000 | ORAL_TABLET | Freq: Three times a day (TID) | ORAL | Status: DC | PRN

## 2013-11-09 MED ORDER — LEVOFLOXACIN 500 MG PO TABS: 500.0000 mg | ORAL_TABLET | Freq: Every day | ORAL | Status: DC

## 2013-11-09 NOTE — Telephone Encounter (Signed)
Called and spoke with pt and she is requesting a refill of the hydrocodone.   Pt also requesting that an abx be sent in to her pharmacy.  Pt stated that she started yesterday with a cold, nasal drainage and cough with green sputum.  Pt also stated that SN changed her from the atenolol to the metoprolol and she thinks that this is making her very sleepy daily.   SN please advise. Thanks  No Known Allergies  Current Outpatient Prescriptions on File Prior to Visit  Medication Sig Dispense Refill  . ADDERALL 15 MG tablet Take 1 tablet by mouth daily as needed.      Marland Kitchen albuterol (PROAIR HFA) 108 (90 BASE) MCG/ACT inhaler Inhale 2 puffs into the lungs every 4 (four) hours as needed for wheezing.  3 each  3  . albuterol (PROVENTIL) (2.5 MG/3ML) 0.083% nebulizer solution Take 3 mLs (2.5 mg total) by nebulization 3 (three) times daily. DX 493.20  360 mL  12  . ALPRAZolam (XANAX) 1 MG tablet Take 1 tablet by mouth three times daily as needed for nerves per psychiatry      . atorvastatin (LIPITOR) 40 MG tablet Take 1 tablet (40 mg total) by mouth daily.  30 tablet  6  . Cholecalciferol (VITAMIN D) 2000 UNITS CAPS Take 2 capsules by mouth daily.       . DULoxetine (CYMBALTA) 30 MG capsule Take 2 capsules by mouth daily.      . Fluticasone-Salmeterol (ADVAIR DISKUS) 250-50 MCG/DOSE AEPB Inhale 1 puff into the lungs 2 (two) times daily.  60 each  5  . HYDROcodone-acetaminophen (NORCO/VICODIN) 5-325 MG per tablet Take 1 tablet by mouth 3 (three) times daily as needed (minimal pain).  90 tablet  0  . metoprolol (LOPRESSOR) 50 MG tablet Take 1 tablet (50 mg total) by mouth 2 (two) times daily.  60 tablet  6  . omeprazole (PRILOSEC) 20 MG capsule Take 1 capsule (20 mg total) by mouth 2 (two) times daily.  180 capsule  1  . OXYGEN-HELIUM IN Inhale 2.5 L into the lungs continuous.      Marland Kitchen spironolactone (ALDACTONE) 25 MG tablet Take 0.5 tablets (12.5 mg total) by mouth daily.  15 tablet  3  . tretinoin (RETIN-A)  0.025 % cream Apply 1 application topically at bedtime. Or as directed       No current facility-administered medications on file prior to visit.

## 2013-11-09 NOTE — Telephone Encounter (Signed)
Per SN---  Ok for the levaquin 500 mg #7  1 daily and take align once daily while on the abx.   Last refill of the hydrocodone was 7/30 for #90.  rx has been printed out and placed on SN cart to be signed.

## 2013-11-09 NOTE — Telephone Encounter (Signed)
Called and spoke with pt and she is aware of rx that has been sent to the pharmacy for the levaquin.  She is aware of the rx for the hydrocodone has been left up front and she will come by and pick this up.

## 2013-11-14 DIAGNOSIS — F332 Major depressive disorder, recurrent severe without psychotic features: Secondary | ICD-10-CM | POA: Diagnosis not present

## 2013-11-15 ENCOUNTER — Other Ambulatory Visit (INDEPENDENT_AMBULATORY_CARE_PROVIDER_SITE_OTHER): Payer: Medicare Other

## 2013-11-15 ENCOUNTER — Telehealth: Payer: Self-pay | Admitting: Pulmonary Disease

## 2013-11-15 DIAGNOSIS — I2789 Other specified pulmonary heart diseases: Secondary | ICD-10-CM | POA: Diagnosis not present

## 2013-11-15 LAB — BASIC METABOLIC PANEL
BUN: 11 mg/dL (ref 6–23)
CALCIUM: 9.5 mg/dL (ref 8.4–10.5)
CO2: 35 mEq/L — ABNORMAL HIGH (ref 19–32)
CREATININE: 0.7 mg/dL (ref 0.4–1.2)
Chloride: 94 mEq/L — ABNORMAL LOW (ref 96–112)
GFR: 89.1 mL/min (ref >60.00)
GLUCOSE: 98 mg/dL (ref 70–99)
POTASSIUM: 4.3 meq/L (ref 3.5–5.1)
Sodium: 135 mEq/L (ref 135–145)

## 2013-11-15 NOTE — Telephone Encounter (Signed)
lmomtcb x1 

## 2013-11-16 MED ORDER — AMOXICILLIN-POT CLAVULANATE 875-125 MG PO TABS: 1.0000 | ORAL_TABLET | Freq: Two times a day (BID) | ORAL | Status: DC

## 2013-11-16 NOTE — Telephone Encounter (Signed)
Called, spoke with pt -  1.  Reports she has finished 7 days of levaquin.  Feels cough and congestion are only slightly improved.  Still feels a lot of congestion in chest and cough with white to thick, dark brown mucus.  Pt states she's taken augmentin in the past and worked really well.  Would like to know if Dr. Lenna Gilford feels it would be appropriate.  Please advise.  Thank you. 2.  Pt is taking metoprolol 50 mg bid.  Reports when she was at Dr. Clayborne Dana office BP was elevated.  VS entered in his note from 11/08/2013 are as follows: VITALS:  BP 160/86  Pulse 61  Wt 161 lb 12 oz (73.369 kg)  SpO2 91% Pt then reports BP was taken in office yesterday and was in the 140s.  She is requesting further recs on BP.  Please advise.  Thank you.

## 2013-11-16 NOTE — Telephone Encounter (Signed)
Pt returning call can be reached @ [phone].[name] [name]

## 2013-11-16 NOTE — Telephone Encounter (Signed)
[id number]. Ruskin Bing, CMA

## 2013-11-16 NOTE — Telephone Encounter (Signed)
Per SN---  augmentin 875 mg  #14  1 po bid Have the pt to cont the same meds for now and cont to monitor her BP at home.  Pt is aware and nothing further is needed.   Mail rx for the advair to the pt on Monday.

## 2013-11-16 NOTE — Telephone Encounter (Signed)
[email or web address]. Tama Bing, CMA

## 2013-11-19 MED ORDER — FLUTICASONE-SALMETEROL 250-50 MCG/DOSE IN AEPB: 1.0000 | INHALATION_SPRAY | Freq: Two times a day (BID) | RESPIRATORY_TRACT | Status: DC

## 2013-11-19 NOTE — Telephone Encounter (Signed)
rx has been printed out and placed in the mail to the pt per her request.  Nothing further is needed.

## 2013-11-20 ENCOUNTER — Telehealth: Payer: Self-pay | Admitting: Pulmonary Disease

## 2013-11-20 NOTE — Telephone Encounter (Signed)
Pt returning call says the # to the [facility] is [phone].[name] [name]

## 2013-11-20 NOTE — Telephone Encounter (Signed)
LMTCB

## 2013-11-21 NOTE — Telephone Encounter (Signed)
Last PN states that the rxs were mailed to her  [facility]

## 2013-11-22 NOTE — Telephone Encounter (Signed)
Spoke with the pt and notified rxs have already been mailed to her  She verbalized understanding  Nothing further needed

## 2013-11-22 NOTE — Telephone Encounter (Signed)
LMTCB

## 2013-11-22 NOTE — Telephone Encounter (Signed)
Pt returned call & can be reached at [phone].  Satira Anis

## 2013-11-26 DIAGNOSIS — F332 Major depressive disorder, recurrent severe without psychotic features: Secondary | ICD-10-CM | POA: Diagnosis not present

## 2013-11-27 ENCOUNTER — Telehealth: Payer: Self-pay | Admitting: Pulmonary Disease

## 2013-11-27 MED ORDER — PREDNISONE (PAK) 10 MG PO TABS: ORAL_TABLET | ORAL | Status: DC

## 2013-11-27 MED ORDER — AMOXICILLIN-POT CLAVULANATE 875-125 MG PO TABS: 1.0000 | ORAL_TABLET | Freq: Two times a day (BID) | ORAL | Status: DC

## 2013-11-27 NOTE — Telephone Encounter (Signed)
Spoke with the pt and notified of recs per SN  She verbalized understanding  Rxs were sent to [facility]

## 2013-11-27 NOTE — Telephone Encounter (Signed)
Called spoke with patient who reported prod cough with brown mucus that has turned to green this AM, some tightness in chest w/ coughing x10days.  Pt denies any increased SOB, wheezing, f/c/s, n/v/d, hemoptysis, PND, leg swelling.  Pt is requesting augmentin and steroid - Dr Lenna Gilford please advise, thank you. Pt would also like to know if she is a candidate for flu shot   [facility] NKDA - verified Her upcoming appt with Dr Doug Sou is scheduled for 10.22.15

## 2013-11-27 NOTE — Telephone Encounter (Signed)
Per SN--  augmentin 875 mg  #14  1 po bid pred dosepak  10 mg  6 day pack

## 2013-12-27 ENCOUNTER — Other Ambulatory Visit (INDEPENDENT_AMBULATORY_CARE_PROVIDER_SITE_OTHER): Payer: Medicare Other

## 2013-12-27 ENCOUNTER — Ambulatory Visit (INDEPENDENT_AMBULATORY_CARE_PROVIDER_SITE_OTHER): Payer: Medicare Other | Admitting: Internal Medicine

## 2013-12-27 ENCOUNTER — Encounter: Payer: Self-pay | Admitting: Internal Medicine

## 2013-12-27 VITALS — BP 128/78 | HR 64 | Temp 98.3°F | Resp 18 | Ht 63.0 in | Wt 159.0 lb

## 2013-12-27 DIAGNOSIS — R739 Hyperglycemia, unspecified: Secondary | ICD-10-CM

## 2013-12-27 DIAGNOSIS — R7309 Other abnormal glucose: Secondary | ICD-10-CM

## 2013-12-27 DIAGNOSIS — M797 Fibromyalgia: Secondary | ICD-10-CM

## 2013-12-27 DIAGNOSIS — F329 Major depressive disorder, single episode, unspecified: Secondary | ICD-10-CM

## 2013-12-27 DIAGNOSIS — I10 Essential (primary) hypertension: Secondary | ICD-10-CM

## 2013-12-27 DIAGNOSIS — G894 Chronic pain syndrome: Secondary | ICD-10-CM | POA: Diagnosis not present

## 2013-12-27 DIAGNOSIS — E7800 Pure hypercholesterolemia, unspecified: Secondary | ICD-10-CM

## 2013-12-27 DIAGNOSIS — R2681 Unsteadiness on feet: Secondary | ICD-10-CM | POA: Diagnosis not present

## 2013-12-27 DIAGNOSIS — F411 Generalized anxiety disorder: Secondary | ICD-10-CM | POA: Diagnosis not present

## 2013-12-27 DIAGNOSIS — F32A Depression, unspecified: Secondary | ICD-10-CM

## 2013-12-27 LAB — HEMOGLOBIN A1C: Hgb A1c MFr Bld: 6.1 % (ref 4.6–6.5)

## 2013-12-27 MED ORDER — PREGABALIN 50 MG PO CAPS: 50.0000 mg | ORAL_CAPSULE | Freq: Two times a day (BID) | ORAL | Status: DC | PRN

## 2013-12-27 NOTE — Assessment & Plan Note (Signed)
Do not feel that hydrocodone is a safe medicine in this patient with confusion and drowsiness during the day. She was advised NOT to take anymore. Will trial low dose lyrica 50 mg BID for pain. Will NOT refill her hydrocodone.

## 2013-12-27 NOTE — Progress Notes (Signed)
Pre visit review using our clinic review tool, if applicable. No additional management support is needed unless otherwise documented below in the visit note.

## 2013-12-27 NOTE — Assessment & Plan Note (Signed)
Very uncomfortable with this patient taking xanax at all. She is filling q 2 months making estimated 45 pills per month. When asked she states she drops a lot of pills and denies taking that much. Her husband does not comment about her usage to confirm or deny. Will forward this note to her psychiatrist and make it clear that she is a high fall risk and given her memory impairment at this time is not a good medicine for her. Have asked her to STOP taking this medicine and discuss alternatives.

## 2013-12-27 NOTE — Assessment & Plan Note (Signed)
Have asked her to STOP 3 high risk medications: adderall, xanax, hydrocodone.

## 2013-12-27 NOTE — Assessment & Plan Note (Signed)
Patient is not well controlled on cymbalta at this time. Will STOP opioid as this is not an appropriate treatment for fibromyalgia. Will trial lyrica 50 mg BID for pain.

## 2013-12-27 NOTE — Assessment & Plan Note (Signed)
Continue lipitor.

## 2013-12-27 NOTE — Assessment & Plan Note (Signed)
BP controlled with metoprolol and spironolactone.

## 2013-12-27 NOTE — Progress Notes (Signed)
   Subjective:    Patient ID: [name] [name], female    DOB: Sep 01, 1950, 63 y.o.   MRN: [id number]  HPI The patient is a 63 YO female who is coming in to establish care. She has PMH of bipolar, anxiety, pain, HOCM, pulmonary HTN, IBS, fibromyalgia. She is not having any problems per say but wants to find out if they can fix her lungs. She denies chest pains, SOB, abdominal pain. She is drowsy throughout our conversation and is not able to well describe why she is taking any of her medicines from her psychiatrist. She states that the adderall is for sleepiness but she is also taking hydrocodone and xanax which can make her sleepy. She does have some problems with falls at home. She denies injury from fall. Her husband is present but does not add much information to the conversation.   Review of Systems  Constitutional: Positive for activity change. Negative for fever, appetite change, fatigue and unexpected weight change.  HENT: Negative.   Eyes: Negative.   Respiratory: Positive for shortness of breath. Negative for cough, chest tightness and wheezing.   Cardiovascular: Negative for chest pain, palpitations and leg swelling.  Gastrointestinal: Negative for abdominal pain, diarrhea, constipation and abdominal distention.  Musculoskeletal: Positive for arthralgias, back pain and myalgias.  Neurological: Negative for dizziness, weakness and numbness.      Objective:   Physical Exam  Constitutional: She appears well-developed and well-nourished.  HENT:  Head: Normocephalic and atraumatic.  Eyes: EOM are normal.  Neck: Normal range of motion.  Cardiovascular: Normal rate and regular rhythm.   Pulmonary/Chest: Effort normal and breath sounds normal. No respiratory distress. She has no wheezes. She has no rales.  On oxygen  Abdominal: Soft. Bowel sounds are normal.  Neurological: She is alert.  Some staggering with walking.   Skin: Skin is warm and dry.  Psychiatric:  Scattered  during the conversation and unable to recall a lot of information about her care.    Filed Vitals:   12/27/13 0911  BP: 128/78  Pulse: 64  Temp: 98.3 F (36.8 C)  TempSrc: Oral  Resp: 18  Height: _0  (1.6 m)  Weight: 159 lb (72.122 kg)  SpO2: 91%      Assessment & Plan:

## 2013-12-27 NOTE — Patient Instructions (Signed)
We will have you try lyrica for pain. You can take 1 pill twice a day for pain if you need it. There are different doses and if you are not doing well on this dose we can always adjust things.   We would like you to work on not taking the hydrocodone at all as this medicine can make you drowsy and increase your risk of falling.   The medicines that your psychiatrist is giving you can be dangerous and it may be worthwhile talking to them about alternatives.   The adderall can be very bad for your heart and I feel strongly that this is a dangerous medicine for you and if any alternative exist you should STOP this medicine.   The xanax is a medicine that makes you sleepy and can worsen memory problems and increase your risk of falling and this is a medicine that you should STOP altogether if you are able. It is not a good medicine for you and can cause problems.   Come back in about 3 months so we can check on how you are doing. We will check your blood for any signs of diabetes from the steroid medicine.   Fall Prevention and Home Safety Falls cause injuries and can affect all age groups. It is possible to use preventive measures to significantly decrease the likelihood of falls. There are many simple measures which can make your home safer and prevent falls. OUTDOORS  Repair cracks and edges of walkways and driveways.  Remove high doorway thresholds.  Trim shrubbery on the main path into your home.  Have good outside lighting.  Clear walkways of tools, rocks, debris, and clutter.  Check that handrails are not broken and are securely fastened. Both sides of steps should have handrails.  Have leaves, snow, and ice cleared regularly.  Use sand or salt on walkways during winter months.  In the garage, clean up grease or oil spills. BATHROOM  Install night lights.  Install grab bars by the toilet and in the tub and shower.  Use non-skid mats or decals in the tub or shower.  Place a  plastic non-slip stool in the shower to sit on, if needed.  Keep floors dry and clean up all water on the floor immediately.  Remove soap buildup in the tub or shower on a regular basis.  Secure bath mats with non-slip, double-sided rug tape.  Remove throw rugs and tripping hazards from the floors. BEDROOMS  Install night lights.  Make sure a bedside light is easy to reach.  Do not use oversized bedding.  Keep a telephone by your bedside.  Have a firm chair with side arms to use for getting dressed.  Remove throw rugs and tripping hazards from the floor. KITCHEN  Keep handles on pots and pans turned toward the center of the stove. Use back burners when possible.  Clean up spills quickly and allow time for drying.  Avoid walking on wet floors.  Avoid hot utensils and knives.  Position shelves so they are not too high or low.  Place commonly used objects within easy reach.  If necessary, use a sturdy step stool with a grab bar when reaching.  Keep electrical cables out of the way.  Do not use floor polish or wax that makes floors slippery. If you must use wax, use non-skid floor wax.  Remove throw rugs and tripping hazards from the floor. STAIRWAYS  Never leave objects on stairs.  Place handrails on both sides  of stairways and use them. Fix any loose handrails. Make sure handrails on both sides of the stairways are as long as the stairs.  Check carpeting to make sure it is firmly attached along stairs. Make repairs to worn or loose carpet promptly.  Avoid placing throw rugs at the top or bottom of stairways, or properly secure the rug with carpet tape to prevent slippage. Get rid of throw rugs, if possible.  Have an electrician put in a light switch at the top and bottom of the stairs. OTHER FALL PREVENTION TIPS  Wear low-heel or rubber-soled shoes that are supportive and fit well. Wear closed toe shoes.  When using a stepladder, make sure it is fully opened  and both spreaders are firmly locked. Do not climb a closed stepladder.  Add color or contrast paint or tape to grab bars and handrails in your home. Place contrasting color strips on first and last steps.  Learn and use mobility aids as needed. Install an electrical emergency response system.  Turn on lights to avoid dark areas. Replace light bulbs that burn out immediately. Get light switches that glow.  Arrange furniture to create clear pathways. Keep furniture in the same place.  Firmly attach carpet with non-skid or double-sided tape.  Eliminate uneven floor surfaces.  Select a carpet pattern that does not visually hide the edge of steps.  Be aware of all pets. OTHER HOME SAFETY TIPS  Set the water temperature for 120 F (48.8 C).  Keep emergency numbers on or near the telephone.  Keep smoke detectors on every level of the home and near sleeping areas. Document Released: 02/12/2002 Document Revised: 08/24/2011 Document Reviewed: 05/14/2011 [facility] Patient Information 2015 [place], [place]. This information is not intended to replace advice given to you by your health care provider. Make sure you discuss any questions you have with your health care provider.

## 2013-12-27 NOTE — Assessment & Plan Note (Signed)
Her cymbalta may be helping some but not enough for her mood. She is also taking adderall for some unknown reason (she states for drowsiness) and have asked her to STOP taking this medication. She is a very high cardiac risk given her concomitant HOCM (hypertrophic cardiomyopathy) as well as her pulmonary hypertension. Will forward this to her psychiatrist and ask them to STOP prescribing this medicine for the patient and consider sleep evaluation or alternative regimen.

## 2014-01-07 ENCOUNTER — Encounter: Payer: Self-pay | Admitting: Internal Medicine

## 2014-01-10 DIAGNOSIS — F332 Major depressive disorder, recurrent severe without psychotic features: Secondary | ICD-10-CM | POA: Diagnosis not present

## 2014-01-15 ENCOUNTER — Telehealth: Payer: Self-pay | Admitting: Pulmonary Disease

## 2014-01-15 NOTE — Telephone Encounter (Signed)
Per SN---  Ok to change the lipitor 40 to crestor 10--this is the lowest dose medication.  Will call pt tomorrow to make her aware.

## 2014-01-15 NOTE — Telephone Encounter (Signed)
Called and spoke with pt and she stated that she is concerned that the lipitor is not helping her and may be causing her more problems, since she is on the beta blockers and she has heard  That the lipitor can cause more liver problems.  She stated that she does not need any more problems.  She wanted to know if SN feels that she can change her lipitor to something else.  SN please advise. Thanks   No Known Allergies  Current Outpatient Prescriptions on File Prior to Visit  Medication Sig Dispense Refill  . ADDERALL 15 MG tablet Take 1 tablet by mouth daily as needed.    Marland Kitchen albuterol (PROAIR HFA) 108 (90 BASE) MCG/ACT inhaler Inhale 2 puffs into the lungs every 4 (four) hours as needed for wheezing. 3 each 3  . albuterol (PROVENTIL) (2.5 MG/3ML) 0.083% nebulizer solution Take 3 mLs (2.5 mg total) by nebulization 3 (three) times daily. DX 493.20 360 mL 12  . ALPRAZolam (XANAX) 1 MG tablet Take 1 tablet by mouth three times daily as needed for nerves per psychiatry    . atorvastatin (LIPITOR) 40 MG tablet Take 1 tablet (40 mg total) by mouth daily. 30 tablet 6  . Cholecalciferol (VITAMIN D) 2000 UNITS CAPS Take 2 capsules by mouth daily.     . DULoxetine (CYMBALTA) 30 MG capsule Take 2 capsules by mouth daily.    . Fluticasone-Salmeterol (ADVAIR DISKUS) 250-50 MCG/DOSE AEPB Inhale 1 puff into the lungs 2 (two) times daily. 60 each 5  . HYDROcodone-acetaminophen (NORCO/VICODIN) 5-325 MG per tablet Take 1 tablet by mouth 3 (three) times daily as needed (minimal pain). 90 tablet 0  . metoprolol (LOPRESSOR) 50 MG tablet Take 1 tablet (50 mg total) by mouth 2 (two) times daily. 60 tablet 6  . omeprazole (PRILOSEC) 20 MG capsule Take 1 capsule (20 mg total) by mouth 2 (two) times daily. 180 capsule 1  . OXYGEN-HELIUM IN Inhale 2.5 L into the lungs continuous.    . pregabalin (LYRICA) 50 MG capsule Take 1 capsule (50 mg total) by mouth 2 (two) times daily as needed. 60 capsule 0  . spironolactone  (ALDACTONE) 25 MG tablet Take 0.5 tablets (12.5 mg total) by mouth daily. 15 tablet 3  . tretinoin (RETIN-A) 0.025 % cream Apply 1 application topically at bedtime. Or as directed     No current facility-administered medications on file prior to visit.

## 2014-01-16 ENCOUNTER — Telehealth: Payer: Self-pay

## 2014-01-16 ENCOUNTER — Other Ambulatory Visit: Payer: Self-pay

## 2014-01-16 NOTE — Telephone Encounter (Signed)
lmtcb for pt.  

## 2014-01-16 NOTE — Telephone Encounter (Signed)
Called for PA for Lyrica 50 mg.   PA approved  LVM for pt to call back.   Faxed pharmacy the approval.

## 2014-01-17 NOTE — Telephone Encounter (Signed)
lmtcb x2 

## 2014-01-21 NOTE — Telephone Encounter (Signed)
lmomtcb for pt

## 2014-01-22 ENCOUNTER — Ambulatory Visit (INDEPENDENT_AMBULATORY_CARE_PROVIDER_SITE_OTHER): Payer: Medicare Other | Admitting: Pulmonary Disease

## 2014-01-22 ENCOUNTER — Encounter: Payer: Self-pay | Admitting: Pulmonary Disease

## 2014-01-22 VITALS — BP 140/82 | HR 69 | Temp 97.7°F | Ht 63.0 in | Wt 166.8 lb

## 2014-01-22 DIAGNOSIS — F330 Major depressive disorder, recurrent, mild: Secondary | ICD-10-CM | POA: Diagnosis not present

## 2014-01-22 DIAGNOSIS — I2723 Pulmonary hypertension due to lung diseases and hypoxia: Secondary | ICD-10-CM

## 2014-01-22 DIAGNOSIS — J9621 Acute and chronic respiratory failure with hypoxia: Secondary | ICD-10-CM | POA: Diagnosis not present

## 2014-01-22 DIAGNOSIS — I272 Other secondary pulmonary hypertension: Secondary | ICD-10-CM

## 2014-01-22 DIAGNOSIS — J449 Chronic obstructive pulmonary disease, unspecified: Secondary | ICD-10-CM | POA: Diagnosis not present

## 2014-01-22 DIAGNOSIS — F4310 Post-traumatic stress disorder, unspecified: Secondary | ICD-10-CM | POA: Diagnosis not present

## 2014-01-22 DIAGNOSIS — F411 Generalized anxiety disorder: Secondary | ICD-10-CM | POA: Diagnosis not present

## 2014-01-22 MED ORDER — UMECLIDINIUM-VILANTEROL 62.5-25 MCG/INH IN AEPB: 1.0000 | INHALATION_SPRAY | Freq: Every day | RESPIRATORY_TRACT | Status: DC

## 2014-01-22 MED ORDER — OMEPRAZOLE 20 MG PO CPDR: 20.0000 mg | DELAYED_RELEASE_CAPSULE | Freq: Two times a day (BID) | ORAL | Status: DC

## 2014-01-22 NOTE — Telephone Encounter (Signed)
Spoke with patient-states she has appt today and will speak with SN about this message then. Nothing more needed at this time.

## 2014-01-22 NOTE — Progress Notes (Signed)
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 puff into the lungs daily.    Inhale 1 puff into the lungs daily.  Discontinued Medications   FLUTICASONE-SALMETEROL (ADVAIR DISKUS) 250-50 MCG/DOSE AEPB    Inhale 1 puff into the lungs 2 (two) times daily.   OXYGEN-HELIUM IN    Inhale 2.5 L into the lungs continuous.

## 2014-01-22 NOTE — Patient Instructions (Signed)
Today we updated your med list in our EPIC system...    We decided to try changing the Advair to [facility] - one inhalation daily is all you need...  In addition- you should continue the NEBULIZER w/ ALBUTEROL 3-4 times daily every day...  Start the OTC MUCINEX 676m one tab 4 times daily w/ water to loosen the thick phlegm...  Continue your regular use of the OXYGEN 2-2.5 L/min flow...  Continue the Aldactone250m 1/2 tab each AM...  Let me know when you [facility] & I will send your records to them at your request...  Stay as active as poss, and work to expectorate the sputum & clear your airways...  Call for any questions...  Let's plan a follow up visit in [phone] if needed for problems...Marland KitchenMarland Kitchen

## 2014-02-12 ENCOUNTER — Ambulatory Visit: Payer: Medicare Other | Admitting: Pulmonary Disease

## 2014-02-25 ENCOUNTER — Telehealth: Payer: Self-pay | Admitting: Internal Medicine

## 2014-02-25 NOTE — Telephone Encounter (Signed)
Error

## 2014-02-27 DIAGNOSIS — H2513 Age-related nuclear cataract, bilateral: Secondary | ICD-10-CM | POA: Diagnosis not present

## 2014-02-27 DIAGNOSIS — H40033 Anatomical narrow angle, bilateral: Secondary | ICD-10-CM | POA: Diagnosis not present

## 2014-03-06 ENCOUNTER — Telehealth: Payer: Self-pay | Admitting: Pulmonary Disease

## 2014-03-06 NOTE — Telephone Encounter (Signed)
Called and spoke with pt and she stated that she needed to speak with Dr. Erling Cruz about her medication refills.  She stated that no refills needed by SN.

## 2014-03-21 ENCOUNTER — Emergency Department (HOSPITAL_COMMUNITY)
Admission: EM | Admit: 2014-03-21 | Discharge: 2014-03-24 | Disposition: A | Discharge status: Home / Self Care | Payer: Medicare PPO | Attending: Emergency Medicine | Admitting: Emergency Medicine

## 2014-03-21 ENCOUNTER — Encounter (HOSPITAL_COMMUNITY): Payer: Self-pay | Admitting: Emergency Medicine

## 2014-03-21 ENCOUNTER — Emergency Department (HOSPITAL_COMMUNITY): Payer: Medicare PPO

## 2014-03-21 ENCOUNTER — Telehealth: Payer: Self-pay | Admitting: Pulmonary Disease

## 2014-03-21 DIAGNOSIS — Z79899 Other long term (current) drug therapy: Secondary | ICD-10-CM | POA: Diagnosis not present

## 2014-03-21 DIAGNOSIS — R404 Transient alteration of awareness: Secondary | ICD-10-CM | POA: Insufficient documentation

## 2014-03-21 DIAGNOSIS — E559 Vitamin D deficiency, unspecified: Secondary | ICD-10-CM | POA: Diagnosis not present

## 2014-03-21 DIAGNOSIS — Z8739 Personal history of other diseases of the musculoskeletal system and connective tissue: Secondary | ICD-10-CM | POA: Insufficient documentation

## 2014-03-21 DIAGNOSIS — G894 Chronic pain syndrome: Secondary | ICD-10-CM | POA: Insufficient documentation

## 2014-03-21 DIAGNOSIS — R4182 Altered mental status, unspecified: Secondary | ICD-10-CM | POA: Diagnosis not present

## 2014-03-21 DIAGNOSIS — J449 Chronic obstructive pulmonary disease, unspecified: Secondary | ICD-10-CM | POA: Insufficient documentation

## 2014-03-21 DIAGNOSIS — E780 Pure hypercholesterolemia: Secondary | ICD-10-CM | POA: Insufficient documentation

## 2014-03-21 DIAGNOSIS — R440 Auditory hallucinations: Secondary | ICD-10-CM | POA: Insufficient documentation

## 2014-03-21 DIAGNOSIS — Z87891 Personal history of nicotine dependence: Secondary | ICD-10-CM | POA: Diagnosis not present

## 2014-03-21 DIAGNOSIS — K219 Gastro-esophageal reflux disease without esophagitis: Secondary | ICD-10-CM | POA: Diagnosis not present

## 2014-03-21 DIAGNOSIS — F99 Mental disorder, not otherwise specified: Secondary | ICD-10-CM

## 2014-03-21 DIAGNOSIS — M797 Fibromyalgia: Secondary | ICD-10-CM | POA: Insufficient documentation

## 2014-03-21 DIAGNOSIS — I10 Essential (primary) hypertension: Secondary | ICD-10-CM | POA: Insufficient documentation

## 2014-03-21 DIAGNOSIS — R410 Disorientation, unspecified: Secondary | ICD-10-CM

## 2014-03-21 DIAGNOSIS — R443 Hallucinations, unspecified: Secondary | ICD-10-CM

## 2014-03-21 DIAGNOSIS — F22 Delusional disorders: Secondary | ICD-10-CM | POA: Diagnosis present

## 2014-03-21 LAB — COMPREHENSIVE METABOLIC PANEL
ALK PHOS: 81 U/L (ref 39–117)
ALT: 17 U/L (ref 0–35)
ANION GAP: 12 (ref 5–15)
AST: 23 U/L (ref 0–37)
Albumin: 4.9 g/dL (ref 3.5–5.2)
BILIRUBIN TOTAL: 0.9 mg/dL (ref 0.3–1.2)
BUN: 13 mg/dL (ref 6–23)
CALCIUM: 10 mg/dL (ref 8.4–10.5)
CO2: 28 mmol/L (ref 19–32)
Chloride: 97 mEq/L (ref 96–112)
Creatinine, Ser: 0.65 mg/dL (ref 0.50–1.10)
GFR calc non Af Amer: >90 mL/min (ref >90)
Glucose, Bld: 122 mg/dL — ABNORMAL HIGH (ref 70–99)
POTASSIUM: 3.7 mmol/L (ref 3.5–5.1)
Sodium: 137 mmol/L (ref 135–145)
Total Protein: 8.2 g/dL (ref 6.0–8.3)

## 2014-03-21 LAB — CBC WITH DIFFERENTIAL/PLATELET
BASOS ABS: 0 10*3/uL (ref 0.0–0.1)
BASOS PCT: 1 % (ref 0–1)
EOS ABS: 0.1 10*3/uL (ref 0.0–0.7)
Eosinophils Relative: 1 % (ref 0–5)
HEMATOCRIT: 49.4 % — AB (ref 36.0–46.0)
Hemoglobin: 16.3 g/dL — ABNORMAL HIGH (ref 12.0–15.0)
Lymphocytes Relative: 19 % (ref 12–46)
Lymphs Abs: 1.5 10*3/uL (ref 0.7–4.0)
MCH: 32 pg (ref 26.0–34.0)
MCHC: 33 g/dL (ref 30.0–36.0)
MCV: 97.1 fL (ref 78.0–100.0)
MONO ABS: 0.4 10*3/uL (ref 0.1–1.0)
MONOS PCT: 6 % (ref 3–12)
NEUTROS PCT: 73 % (ref 43–77)
Neutro Abs: 5.9 10*3/uL (ref 1.7–7.7)
Platelets: 247 10*3/uL (ref 150–400)
RBC: 5.09 MIL/uL (ref 3.87–5.11)
RDW: 12.5 % (ref 11.5–15.5)
WBC: 8 10*3/uL (ref 4.0–10.5)

## 2014-03-21 LAB — URINE MICROSCOPIC-ADD ON

## 2014-03-21 LAB — URINALYSIS, ROUTINE W REFLEX MICROSCOPIC
Bilirubin Urine: NEGATIVE
Glucose, UA: NEGATIVE mg/dL
HGB URINE DIPSTICK: NEGATIVE
Ketones, ur: >80 mg/dL — AB
NITRITE: NEGATIVE
Protein, ur: NEGATIVE mg/dL
Specific Gravity, Urine: 1.03 (ref 1.005–1.030)
Urobilinogen, UA: 0.2 mg/dL (ref 0.0–1.0)
pH: 6 (ref 5.0–8.0)

## 2014-03-21 LAB — ETHANOL

## 2014-03-21 LAB — ACETAMINOPHEN LEVEL: Acetaminophen (Tylenol), Serum: <10 ug/mL — ABNORMAL LOW (ref 10–30)

## 2014-03-21 LAB — I-STAT TROPONIN, ED: Troponin i, poc: 0 ng/mL (ref 0.00–0.08)

## 2014-03-21 LAB — SALICYLATE LEVEL: Salicylate Lvl: <4 mg/dL (ref 2.8–20.0)

## 2014-03-21 MED ORDER — ARFORMOTEROL TARTRATE 15 MCG/2ML IN NEBU
15.0000 ug | INHALATION_SOLUTION | Freq: Two times a day (BID) | RESPIRATORY_TRACT | Status: DC
  Administered 2014-03-21 – 2014-03-24 (×5): 15 ug via RESPIRATORY_TRACT
  Filled 2014-03-21 (×12): qty 2

## 2014-03-21 MED ORDER — CEPHALEXIN 500 MG PO CAPS
500.0000 mg | ORAL_CAPSULE | Freq: Three times a day (TID) | ORAL | Status: DC
  Administered 2014-03-21 – 2014-03-24 (×8): 500 mg via ORAL
  Filled 2014-03-21 (×8): qty 1

## 2014-03-21 MED ORDER — UMECLIDINIUM-VILANTEROL 62.5-25 MCG/INH IN AEPB: 1.0000 | INHALATION_SPRAY | Freq: Every day | RESPIRATORY_TRACT | Status: DC

## 2014-03-21 MED ORDER — DULOXETINE HCL 60 MG PO CPEP
60.0000 mg | ORAL_CAPSULE | Freq: Every day | ORAL | Status: DC
  Administered 2014-03-21 – 2014-03-24 (×4): 60 mg via ORAL
  Filled 2014-03-21 (×5): qty 1

## 2014-03-21 MED ORDER — ACETAMINOPHEN 325 MG PO TABS
650.0000 mg | ORAL_TABLET | ORAL | Status: DC | PRN
  Administered 2014-03-23: 650 mg via ORAL
  Filled 2014-03-21: qty 2

## 2014-03-21 MED ORDER — ATORVASTATIN CALCIUM 40 MG PO TABS
40.0000 mg | ORAL_TABLET | Freq: Every day | ORAL | Status: DC
  Administered 2014-03-22 – 2014-03-24 (×3): 40 mg via ORAL
  Filled 2014-03-21 (×4): qty 1

## 2014-03-21 MED ORDER — ALBUTEROL SULFATE HFA 108 (90 BASE) MCG/ACT IN AERS
2.0000 | INHALATION_SPRAY | RESPIRATORY_TRACT | Status: DC | PRN
  Filled 2014-03-21: qty 6.7

## 2014-03-21 MED ORDER — PANTOPRAZOLE SODIUM 40 MG PO TBEC
40.0000 mg | DELAYED_RELEASE_TABLET | Freq: Every day | ORAL | Status: DC
  Administered 2014-03-21 – 2014-03-24 (×4): 40 mg via ORAL
  Filled 2014-03-21 (×4): qty 1

## 2014-03-21 MED ORDER — HYDRALAZINE HCL 20 MG/ML IJ SOLN
10.0000 mg | Freq: Once | INTRAMUSCULAR | Status: AC
  Administered 2014-03-21: 10 mg via INTRAMUSCULAR
  Filled 2014-03-21: qty 1

## 2014-03-21 MED ORDER — TIOTROPIUM BROMIDE MONOHYDRATE 18 MCG IN CAPS
18.0000 ug | ORAL_CAPSULE | Freq: Every day | RESPIRATORY_TRACT | Status: DC
  Administered 2014-03-22 – 2014-03-24 (×2): 18 ug via RESPIRATORY_TRACT
  Filled 2014-03-21: qty 5

## 2014-03-21 MED ORDER — METOPROLOL TARTRATE 25 MG PO TABS
50.0000 mg | ORAL_TABLET | Freq: Two times a day (BID) | ORAL | Status: DC
  Administered 2014-03-21 – 2014-03-24 (×7): 50 mg via ORAL
  Filled 2014-03-21 (×7): qty 2

## 2014-03-21 MED ORDER — LORAZEPAM 1 MG PO TABS
1.0000 mg | ORAL_TABLET | Freq: Three times a day (TID) | ORAL | Status: DC | PRN
  Administered 2014-03-22 – 2014-03-23 (×4): 1 mg via ORAL
  Filled 2014-03-21 (×4): qty 1

## 2014-03-21 NOTE — ED Notes (Signed)
Respiratory called for oPt's breathing treatments.

## 2014-03-21 NOTE — ED Notes (Signed)
MD at bedside.

## 2014-03-21 NOTE — ED Notes (Signed)
Patient transported to X-ray 

## 2014-03-21 NOTE — ED Notes (Signed)
Pt with Hx of COPD, states she has not been taking her medication alprazolam and metoprolol as prescribed, states starting 2 days ago "I feel like I am being controlled through the tickles in my ear," pt refers to right ear, states she believes there is someone in her phone talking to her all of the time, even if the phone is off or across the room. Pt's family member states patient was talking about people up in the trees talking to her and cutting the tops of the trees off, even though there is no one there. Pt and pt's family member states patient had a history of the same paranoid delusion and hallucinations 2 years ago. Pt denies any thoughts of harming self or others.

## 2014-03-21 NOTE — ED Notes (Signed)
Sent note to pharmacy asking for Cymbalta, waiting for medication to arrive.

## 2014-03-21 NOTE — ED Notes (Signed)
La called Secretary to inform that more urine needed. NT made sure that lab had at least 48m to be ale to run test. Patient unable to give anymore urine at this time.

## 2014-03-21 NOTE — ED Notes (Signed)
Pt. Attempted to give urine but unsuccessful.
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
## 2014-03-21 NOTE — Telephone Encounter (Signed)
Spoke with pt's husband, states that pt has been hallucinating X2 days- talking about "isis has control of me" and "people up in the trees", not making much sense, not able to hold a conversation with husband.  Pt's husband thinks that she has mixed up her medications again.  States that he was going to bring pt in to see SN this afternoon, advised that SN was not in clinic this afternoon but we would get SN's recs and call him back with them.  Last ov: 01/22/14 Next ov: 05/23/14  Dr. Lenna Gilford please advise.  Thank you!  No Known Allergies Current Outpatient Prescriptions on File Prior to Visit  Medication Sig Dispense Refill  . ADDERALL 15 MG tablet Take 1 tablet by mouth daily as needed.    Marland Kitchen albuterol (PROAIR HFA) 108 (90 BASE) MCG/ACT inhaler Inhale 2 puffs into the lungs every 4 (four) hours as needed for wheezing. 3 each 3  . albuterol (PROVENTIL) (2.5 MG/3ML) 0.083% nebulizer solution Take 3 mLs (2.5 mg total) by nebulization 3 (three) times daily. DX 493.20 360 mL 12  . ALPRAZolam (XANAX) 1 MG tablet Take 1 tablet by mouth three times daily as needed for nerves per psychiatry    . atorvastatin (LIPITOR) 40 MG tablet Take 1 tablet (40 mg total) by mouth daily. 30 tablet 6  . Cholecalciferol (VITAMIN D) 2000 UNITS CAPS Take 2 capsules by mouth daily.     . DULoxetine (CYMBALTA) 30 MG capsule Take 2 capsules by mouth daily.    . metoprolol (LOPRESSOR) 50 MG tablet Take 1 tablet (50 mg total) by mouth 2 (two) times daily. 60 tablet 6  . omeprazole (PRILOSEC) 20 MG capsule Take 1 capsule (20 mg total) by mouth 2 (two) times daily. 180 capsule 2  . OXYGEN 2.5 liters 24/7    . pregabalin (LYRICA) 50 MG capsule Take 1 capsule (50 mg total) by mouth 2 (two) times daily as needed. 60 capsule 0  . spironolactone (ALDACTONE) 25 MG tablet Take 0.5 tablets (12.5 mg total) by mouth daily. 15 tablet 3  . tretinoin (RETIN-A) 0.025 % cream Apply 1 application topically at bedtime. Or as directed    .  Umeclidinium-Vilanterol (ANORO ELLIPTA) 62.5-25 MCG/INH AEPB Inhale 1 puff into the lungs daily. 2 each 0   No current facility-administered medications on file prior to visit.

## 2014-03-21 NOTE — ED Provider Notes (Signed)
CSN: [id number]     Arrival date & time 03/21/14  1126 History   First MD Initiated Contact with Patient 03/21/14 1211     Chief Complaint  Patient presents with  . Paranoid  . Off Medications   . Delusional     (Consider location/radiation/quality/duration/timing/severity/associated sxs/prior Treatment) The history is provided by the spouse and the patient.  [name] [name] is a 64 y.o. female hx of COPD, HTN, anxiety, depression here with hallucinations. She's been having hallucinations for the last several days. She has been hearing voices from the right ear. She states that before up in the trees and cutting the trees. She states that somebody is stealing her checkbook and her private information. Denies any suicidal homicidal ideations. She has not been taking her psychiatric medicine. She is brought in by her husband. She is on 2.5 L oxygen for COPD but denies worsening shortness of breath or any chest pain.   Level V caveat- condition of patient   Past Medical History  Diagnosis Date  . COPD (chronic obstructive pulmonary disease)   . Hypertension   . Hypercholesteremia   . GERD (gastroesophageal reflux disease)   . IBS (irritable bowel syndrome)   . DJD (degenerative joint disease)   . Cervical disc disease   . Fibromyalgia   . Vitamin D deficiency   . Chronic pain syndrome   . Chronic headache   . Anxiety disorder    Past Surgical History  Procedure Laterality Date  . Vesicovaginal fistula closure w/ tah  2001  . Anterior cervical decomp/discectomy fusion  2002  . Carpal tunnel release      bilateral  . Vaginal hysterectomy  age 43   Family History  Problem Relation Age of Onset  . Heart disease Mother   . Other Father     asbestos exposure   History  Substance Use Topics  . Smoking status: Former Smoker -- 1.00 packs/day for 30 years    Types: Cigarettes    Quit date: 07/11/2009  . Smokeless tobacco: Former Systems developer    Quit date: 07/11/2009  .  Alcohol Use: No   OB History    Gravida Para Term Preterm AB TAB SAB Ectopic Multiple Living   _0 Review of Systems  Psychiatric/Behavioral: Positive for hallucinations.  All other systems reviewed and are negative.     Allergies  Review of patient's allergies indicates no known allergies.  Home Medications   Prior to Admission medications   Medication Sig Start Date End Date Taking? Authorizing Provider  albuterol (PROAIR HFA) 108 (90 BASE) MCG/ACT inhaler Inhale 2 puffs into the lungs every 4 (four) hours as needed for wheezing. 12/25/12  Yes Noralee Space, MD  albuterol (PROVENTIL) (2.5 MG/3ML) 0.083% nebulizer solution Take 3 mLs (2.5 mg total) by nebulization 3 (three) times daily. DX 493.20 01/24/13  Yes Noralee Space, MD  atorvastatin (LIPITOR) 40 MG tablet Take 1 tablet (40 mg total) by mouth daily. 06/04/13  Yes Noralee Space, MD  Cholecalciferol (VITAMIN D) 2000 UNITS CAPS Take 2 capsules by mouth daily.    Yes Historical Provider, MD  DULoxetine (CYMBALTA) 30 MG capsule Take 2 capsules by mouth daily. 09/06/13  Yes Historical Provider, MD  metoprolol (LOPRESSOR) 50 MG tablet Take 1 tablet (50 mg total) by mouth 2 (two) times daily. 08/08/13  Yes Noralee Space, MD  omeprazole (PRILOSEC) 20 MG capsule Take 1 capsule (  20 mg total) by mouth 2 (two) times daily. Patient taking differently: Take 20 mg by mouth daily.  01/22/14  Yes Noralee Space, MD  OXYGEN 2.5 liters 24/7   Yes Historical Provider, MD  spironolactone (ALDACTONE) 25 MG tablet Take 0.5 tablets (12.5 mg total) by mouth daily. Patient taking differently: Take 12.5 mg by mouth daily as needed (swelling of the feet).  11/08/13  Yes Shaune Pascal Bensimhon, MD  tretinoin (RETIN-A) 0.025 % cream Apply 1 application topically at bedtime as needed (acnce, irriated skin). Or as directed   Yes Historical Provider, MD  Umeclidinium-Vilanterol (ANORO ELLIPTA) 62.5-25 MCG/INH AEPB Inhale 1 puff into the lungs daily.  01/22/14  Yes Noralee Space, MD  pregabalin (LYRICA) 50 MG capsule Take 1 capsule (50 mg total) by mouth 2 (two) times daily as needed. Patient not taking: Reported on 03/21/2014 12/27/13   Olga Millers, MD   BP 189/88 mmHg  Pulse 70  Temp(Src) 97.6 F (36.4 C) (Oral)  Resp 21  SpO2 100% Physical Exam  Constitutional: She is oriented to person, place, and time. She appears well-nourished.  HENT:  Head: Normocephalic.  Right Ear: External ear normal.  Left Ear: External ear normal.  Eyes: Conjunctivae are normal. Pupils are equal, round, and reactive to light.  Neck: Normal range of motion. Neck supple.  Cardiovascular: Normal rate, regular rhythm and normal heart sounds.   Pulmonary/Chest: Effort normal and breath sounds normal. No respiratory distress. She has no wheezes. She has no rales.  Abdominal: Soft. Bowel sounds are normal. She exhibits no distension. There is no tenderness. There is no rebound and no guarding.  Musculoskeletal: Normal range of motion. She exhibits no edema or tenderness.  Neurological: She is alert and oriented to person, place, and time. No cranial nerve deficit. Coordination normal.  Skin: Skin is warm and dry.  Psychiatric: She has a normal mood and affect. Her behavior is normal. Judgment and thought content normal.  Nursing note and vitals reviewed.   ED Course  Procedures (including critical care time) Labs Review Labs Reviewed  CBC WITH DIFFERENTIAL - Abnormal; Notable for the following:    Hemoglobin 16.3 (*)    HCT 49.4 (*)    All other components within normal limits  COMPREHENSIVE METABOLIC PANEL - Abnormal; Notable for the following:    Glucose, Bld 122 (*)    All other components within normal limits  ACETAMINOPHEN LEVEL - Abnormal; Notable for the following:    Acetaminophen (Tylenol), Serum <10.0 (*)    All other components within normal limits  ETHANOL  SALICYLATE LEVEL  URINALYSIS, ROUTINE W REFLEX MICROSCOPIC  URINE RAPID  DRUG SCREEN (HOSP PERFORMED)  I-STAT TROPOININ, ED    Imaging Review Dg Chest 2 View  03/21/2014   CLINICAL DATA:  History of COPD a recent hallucinations and confusion  EXAM: CHEST  2 VIEW  COMPARISON:  Chest x-ray of 08/08/2013  FINDINGS: No active infiltrate or effusion is seen. Minimal scarring at the left lung base is stable. Mediastinal and hilar contours are unremarkable. The heart is borderline enlarged. No bony abnormality is seen.  IMPRESSION: Stable scarring at the left lung base.  No definite active process.   Electronically Signed   By: Ivar Drape M.D.   On: 03/21/2014 13:23   Ct Head Wo Contrast  03/21/2014   CLINICAL DATA:  Acute mental status changes with confusion and hallucinations.  EXAM: CT HEAD WITHOUT CONTRAST  TECHNIQUE: Contiguous axial images were obtained from the  base of the skull through the vertex without intravenous contrast.  COMPARISON:  MRI brain 06/26/2012.  CT head 06/25/2012.  FINDINGS: Ventricular system normal in size and appearance for age. Minimal changes of small vessel disease of the white matter, unchanged. No mass lesion. No midline shift. No acute hemorrhage or hematoma. No extra-axial fluid collections. No evidence of acute infarction. No significant interval change.  No skull fracture or other focal osseous abnormality involving the skull. Opacification of right mastoid air cells and mucosal thickening involving the visualized superior maxillary sinuses. Bilateral mastoid air cells and bilateral middle ear cavities well aerated. Mild bilateral carotid siphon atherosclerosis.  IMPRESSION: 1. No acute intracranial abnormality. 2. Stable minimal chronic microvascular ischemic changes of the white matter. 3. Chronic right ethmoid and bilateral maxillary sinus disease.   Electronically Signed   By: Evangeline Dakin M.D.   On: 03/21/2014 13:33     EKG Interpretation   Date/Time:  Thursday March 21 2014 12:25:48 EST Ventricular Rate:  65 PR Interval:   162 QRS Duration: 102 QT Interval:  468 QTC Calculation: 487 R Axis:   53 Text Interpretation:  Sinus rhythm Inferior infarct, old No significant  change since last tracing Confirmed by Deroy Noah  MD, Lillee Mooneyhan ([id number]) on 03/21/2014  12:35:50 PM      MDM   Final diagnoses:  COPD (chronic obstructive pulmonary disease)  Hallucination   EXIE CHRISMER is a 64 y.o. female here with hallucinations. Labs at baseline. CT head unremarkable. Off psych meds. Medically cleared. She is on O2 and CXR showed no acute process. Pending psych consult.      Wandra Arthurs, MD 03/21/14 [phone]

## 2014-03-21 NOTE — Telephone Encounter (Signed)
pts husband brought the pt into the office this morning.  Tammy and I went out to speak with them after talking with SN.  Per SN--the pt should go to the ER for further eval for her confusion.  We spoke with the pt and her husband and he is aware and stated that he will take her to the ER.  Pt stated that people have been telling her to stop taking all of her medications through her ear bud and then they type this across her TV screen.  Pt seemed a little anxious about telling this story but she was calm in the office.  Husband took the pt and stated that he will take her to the [facility] ER>

## 2014-03-21 NOTE — ED Provider Notes (Signed)
6:55 PM  I discussed the case with the psych PA thought that her symptoms may be related to a UTI. Once her urinalysis returned I reexamined the patient. Her urinalysis is not impressive and I feel it is unlikely that her paranoid delusions are related to UTI. We'll treat empirically regardless while psych looks for placement.  Pamella Pert, MD 03/21/14 [phone]

## 2014-03-21 NOTE — BH Assessment (Signed)
Writer informed TTS Marlou Porch) of the consult.

## 2014-03-22 DIAGNOSIS — R4182 Altered mental status, unspecified: Secondary | ICD-10-CM | POA: Diagnosis not present

## 2014-03-22 MED ORDER — RISPERIDONE 0.5 MG PO TABS
0.5000 mg | ORAL_TABLET | Freq: Two times a day (BID) | ORAL | Status: DC
  Administered 2014-03-22 – 2014-03-23 (×3): 0.5 mg via ORAL
  Filled 2014-03-22 (×3): qty 1

## 2014-03-22 NOTE — ED Notes (Signed)
Made respiratory aware neb treatment and Spiriva still pending. Stated that RN didn't need to admininster at this time, she would be coming to administer.

## 2014-03-22 NOTE — ED Notes (Signed)
Pt actively vomiting, diaphoretic, increase in BP and HR noted. Pt continues to take [place] off, needs constant redirection. Spoke with CN, pt to be moved to acute side.

## 2014-03-22 NOTE — [facility] Counselor (Signed)
TTS Counselor spoke with Dr. Aline Brochure via telephone at 23:15 on 03/21/14. Dr. Aline Brochure believes that pt would benefit from re-assessment with TTS, as he does not think that present psychiatric symptoms are a result of UTI. No elevated WBC count or any indications that symptomology is not psychiatric at this point.  TTS Counselor anticipated having time to re-assess pt but after all consults were completed, it was 3 AM and counselor did not want to wake pt or bother other pt's sleeping in TCU. Therefore, this writer informed next shift of need for re-assessment. Psychiatry will also assess pt this AM when they make their rounds.    Ramond Dial, [facility] Triage Specialist

## 2014-03-22 NOTE — ED Notes (Signed)
Pt allowed VS to be taken at this time. Pt states that she uses a pulse ox at home, but was unable to state what her usual numbers read stating "Fritz Pickerel has to help me with those, but they fluctuate with the market. Mama used to fluctuate with the market and the piggy, piggy, piggy, piggies. I wouldn't go with her, but she took care of me." Pt remains standing in room, smiling inappropriately, but has her [place] in place.
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
## 2014-03-22 NOTE — ED Notes (Signed)
Bed: WA29 Expected date:  Expected time:  Means of arrival:  Comments: Hold RM 25

## 2014-03-22 NOTE — ED Notes (Signed)
Pt continues to be agitated, increasingly so, taking off Trezevant and refusing to let staff place it back on. Pt screaming in her room "Leave me alone" when no one is with her. Pt also stating "Give me a break guys" but will not tell writer what she is referring to. With much encouragement pt able to take 74m PO Ativan at this time.

## 2014-03-22 NOTE — ED Notes (Signed)
Husband at bedside wanting an update.

## 2014-03-22 NOTE — ED Notes (Signed)
Pt actively having paranoid delusions that writer is going to harm her. Attempted to take pt's temperature and while witting with probe under her tongue pt stated "You are not going to do this to me." When asked what the pt was referring to she stated "I'm not going to tell you if you don't already know. Close my door."

## 2014-03-22 NOTE — ED Provider Notes (Signed)
Patient moved from TCU to main ED last night secondary to increasing agitation.  Patient had episode of nausea, vomiting and diaphoresis, was unable to be maintained in the bed on O2.  It was noted the patient recently stopped taking her Xanax recently, I feel the patient is probably going through benzodiazepine withdrawals.  After 1 mg of Ativan, symptoms had improved somewhat, after a second dose of Ativan, patient is calm.  She is still speaking illogically, but can be convinced to stay in the bed with her oxygen on.  She will answer some questions normally.  We'll continue to monitor.  Psychiatry is looking for placement.  Kalman Drape, MD 03/22/14 [phone]

## 2014-03-22 NOTE — BH Assessment (Addendum)
[place] Assessment Progress Note  The following facilities were contacted in an attempt to place the pt by this clinician:  Referral Faxed for [facility] to call back with availablity [facility]-beds per Roderic [facility] referral per Sandi Carne per [facility]-pts being accepted to wait list per [facility]-beds per Cassie [facility]-beds per Cameron Sprang states to fax referral [facility]-left message and referral faxed [facility]-may have beds per Wonda [facility]-beds per Melissa [facility]-beds per Zigmund Daniel (one female bed) [facility]-beds per [place] referral per Maudie Mercury [facility]-beds per Francetta Found Facilitites-Referral Faxed for [facility] [place]-fax per Nunzio Cory, discharges today [place]-beds per [place] per [facility] per Amy at [facility] per Robbi Garter - only one female bed per [place] per [facility] per [facility] [place] per Abigail Butts  No Answer Mikel Cella at 0911-left message  TTS will continue to seek placement for the pt.  Shaune Pascal, MS, [facility] Licensed Professional Counselor Therapeutic Triage Specialist [facility] Phone: [phone] Fax: [phone]

## 2014-03-22 NOTE — ED Notes (Signed)
Made Respiratory aware of Neb treatment and Spiriva ordered.

## 2014-03-22 NOTE — Progress Notes (Signed)
Per staff at [facility], one female bed and two gero beds available; CSW faxed referral.  Peri Maris, [facility] 03/22/2014 2:55 PM

## 2014-03-22 NOTE — ED Notes (Signed)
Pt restless, pt in and out of the bed, pacing down the hallway.

## 2014-03-23 ENCOUNTER — Emergency Department (HOSPITAL_COMMUNITY): Payer: Medicare PPO

## 2014-03-23 DIAGNOSIS — R4182 Altered mental status, unspecified: Secondary | ICD-10-CM | POA: Diagnosis not present

## 2014-03-23 MED ORDER — RISPERIDONE 1 MG PO TABS
1.0000 mg | ORAL_TABLET | Freq: Two times a day (BID) | ORAL | Status: DC
  Administered 2014-03-23 – 2014-03-24 (×2): 1 mg via ORAL
  Filled 2014-03-23 (×2): qty 1

## 2014-03-23 MED ORDER — OXYCODONE-ACETAMINOPHEN 5-325 MG PO TABS: 2.0000 | ORAL_TABLET | Freq: Once | ORAL | Status: DC

## 2014-03-23 NOTE — BH Assessment (Addendum)
Per Stanton Kidney at [place] pt has been accepted to [facility] under the care of Dr. Franchot Mimes bed 523.  Lear Ng, [facility] Triage Specialist 03/23/2014 10:42 PM

## 2014-03-23 NOTE — ED Notes (Signed)
Family at bedside. 

## 2014-03-23 NOTE — ED Notes (Signed)
Husband at bedside. Pt's sister is here also but pt only wanted 1 visitor at a time.

## 2014-03-23 NOTE — ED Notes (Signed)
MD at bedside. 

## 2014-03-23 NOTE — BH Assessment (Signed)
[name] [name], [facility], confirmed adult unit is at capacity. Contacted the following facilities for placement:  BED AVAILABLE, FAXED CLINICAL INFORMATION: [name] [name], per [facility], per [facility], per [facility], per [name] [name]  AT CAPACITY: [name] [name], per [facility], per [facility], per [facility], per [facility], per [facility], per [place], per [facility], per [facility], per [facility], per [employer], per [employer], per [employer] RESPONSE: [facility]  PT DECLINED: [place], [place], [facility] Triage Specialist [phone]

## 2014-03-23 NOTE — BH Assessment (Signed)
Sent IVC to [place] per their request. Reports no bed availability until Monday.    Lear Ng, [facility] Triage Specialist 03/23/2014 8:13 PM

## 2014-03-23 NOTE — ED Notes (Signed)
Pt eating breakfast. RT will return later to deliver treatment.

## 2014-03-23 NOTE — ED Notes (Signed)
Copy of pt's IVC papers faxed to [facility] fax # [phone] (attn: Landry Dyke) as requested by psych assessment team

## 2014-03-23 NOTE — Progress Notes (Signed)
CSW continued inpatient placement. CSW contacted the following facilities for inpatient placement.   Patient continues to [facility] for placement: [facility]--Will need a copy of [place]   Above Reports have not reviewed referral as of yet due to being at capacity but holding referral to review.    AT CAPACITY: [facility]  [place] [facility]  [facility]  No Response  [facility]   Denied due to ACUITY: Lennon Alstrom, [place] Disposition Social Worker [phone]

## 2014-03-23 NOTE — ED Notes (Addendum)
[name] [name] at [place] called for patient status and condition. Informed that patient takes medications and ambulates with 1 assist. She has a sitter at bedside for safety. Pt is not SI or HI.  Per [facility], will present pt information to physician for admission.
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
## 2014-03-23 NOTE — BH Assessment (Signed)
[facility] Assessment Progress Note  Followed up with [place] at [facility], who says that pt is still under review.  She requested IVC paperwork to be sent for further review, and Rn in TCU will fax paperwork to [phone].

## 2014-03-23 NOTE — ED Notes (Addendum)
Patient transported to X-ray 

## 2014-03-23 NOTE — BH Assessment (Signed)
Received call from [place] at [facility] stating Pt is declined due to oxygen dependent.  Orpah Greek Rosana Hoes, [facility] Triage Specialist [phone]

## 2014-03-23 NOTE — Progress Notes (Signed)
2:57pm. CSW faxed IVC paperwork to magistrate. Per [place], paperwork received and sheriff to be dispatched.   Rochele Pages,     ED CSW  phone: [phone]

## 2014-03-23 NOTE — ED Notes (Signed)
Marzetta Board at [place]. Called to Artist of patient's acceptance to Geri-psych unit, 523. RN to call report to unit at  [phone] when patient leaves with [facility] tomorrow.

## 2014-03-23 NOTE — ED Notes (Signed)
D Pt. Has been resting quietly since writer arrived at 1900, pt. Woke up at appr. 2315.  Marland Kitchen Pt. Denies SI and HI, no complaints of pain or discomfort noted.   Pt., is alert to person, and place as well as attempts to explain why she is here not alert to time.  A Writer offered support and encouragement.  R Pt. Remains safe on the unit, pt. Continues to be tangential and has difficulty trying to explain things but remains calm and cooperative.

## 2014-03-24 DIAGNOSIS — F39 Unspecified mood [affective] disorder: Secondary | ICD-10-CM | POA: Insufficient documentation

## 2014-03-24 DIAGNOSIS — R443 Hallucinations, unspecified: Secondary | ICD-10-CM | POA: Insufficient documentation

## 2014-03-24 DIAGNOSIS — R4182 Altered mental status, unspecified: Secondary | ICD-10-CM | POA: Diagnosis not present

## 2014-03-24 NOTE — ED Notes (Signed)
[facility] and let them know that patient might not come today due to transportation. [phone]

## 2014-03-24 NOTE — ED Notes (Signed)
Pt up at bedside rambling, not making sense, word salad. Patient took morning meds with no problem. Patient is not physically aggressive.

## 2014-03-24 NOTE — Progress Notes (Signed)
CSW spoke with [name] [name] at [facility].  Patient has been accepted to [facility].  Accepted: [facility] Accepting MD: Dr. Franchot Mimes Report Number: [phone]  Bed Number: 826   Information given to Patient RN.  Patient can be sent now.   Hillery Hunter, LCSW Disposition Social Worker [phone]
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
## 2014-03-24 NOTE — ED Notes (Signed)
Per night nurse, patient wears oxygen 2L at all times.

## 2014-03-25 LAB — URINE CULTURE: Colony Count: 30000

## 2014-03-29 ENCOUNTER — Ambulatory Visit: Payer: Medicare Other | Admitting: Internal Medicine

## 2014-04-02 ENCOUNTER — Telehealth: Payer: Self-pay | Admitting: Pulmonary Disease

## 2014-04-02 MED ORDER — METOPROLOL TARTRATE 50 MG PO TABS: 50.0000 mg | ORAL_TABLET | Freq: Two times a day (BID) | ORAL | Status: DC

## 2014-04-02 NOTE — Telephone Encounter (Signed)
Per SN:  Ok to give Alprazolam 1 mg #30 one po tid prn.  (further refills will need to come from psyciatrist).    No refills.  Also ok to refills Metoprolol 50 mg # 30 one po bid.  Spoke with Fritz Pickerel and advised of Dr Jeannine Kitten recommendations.  Rx sent to pharmacy.
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
## 2014-04-03 MED ORDER — ALPRAZOLAM 1 MG PO TABS: 1.0000 mg | ORAL_TABLET | Freq: Three times a day (TID) | ORAL | Status: DC | PRN

## 2014-04-04 ENCOUNTER — Telehealth: Payer: Self-pay | Admitting: Pulmonary Disease

## 2014-04-04 NOTE — Telephone Encounter (Signed)
LM with husband to have patient call once available.

## 2014-04-08 MED ORDER — ALBUTEROL SULFATE HFA 108 (90 BASE) MCG/ACT IN AERS: 2.0000 | INHALATION_SPRAY | RESPIRATORY_TRACT | Status: AC | PRN

## 2014-04-08 NOTE — Telephone Encounter (Signed)
Pt needs rx for proair, albuterol puffer, & crestor (generic).  Please call into [facility] on [address]

## 2014-04-08 NOTE — Telephone Encounter (Signed)
Called and spoke to pt. Pt is requesting a refill in proair. Rx sent to preferred pharmacy. Pt also requesting to change from Lipitor to Crestor d/t hearing negative side effects about Lipitor. Pt stated she will stay with Lipitor if SN thinks it is appropriate.   Dr. Lenna Gilford please advise.   No Known Allergies  Current Outpatient Prescriptions on File Prior to Visit  Medication Sig Dispense Refill  . albuterol (PROVENTIL) (2.5 MG/3ML) 0.083% nebulizer solution Take 3 mLs (2.5 mg total) by nebulization 3 (three) times daily. DX 493.20 360 mL 12  . ALPRAZolam (XANAX) 1 MG tablet Take 1 tablet (1 mg total) by mouth 3 (three) times daily as needed for anxiety. 30 tablet 0  . atorvastatin (LIPITOR) 40 MG tablet Take 1 tablet (40 mg total) by mouth daily. 30 tablet 6  . Cholecalciferol (VITAMIN D) 2000 UNITS CAPS Take 2 capsules by mouth daily.     . DULoxetine (CYMBALTA) 30 MG capsule Take 2 capsules by mouth daily.    . metoprolol (LOPRESSOR) 50 MG tablet Take 1 tablet (50 mg total) by mouth 2 (two) times daily. 60 tablet 6  . omeprazole (PRILOSEC) 20 MG capsule Take 1 capsule (20 mg total) by mouth 2 (two) times daily. (Patient taking differently: Take 20 mg by mouth daily. ) 180 capsule 2  . OXYGEN 2.5 liters 24/7    . pregabalin (LYRICA) 50 MG capsule Take 1 capsule (50 mg total) by mouth 2 (two) times daily as needed. (Patient not taking: Reported on 03/21/2014) 60 capsule 0  . spironolactone (ALDACTONE) 25 MG tablet Take 0.5 tablets (12.5 mg total) by mouth daily. (Patient taking differently: Take 12.5 mg by mouth daily as needed (swelling of the feet). ) 15 tablet 3  . tretinoin (RETIN-A) 0.025 % cream Apply 1 application topically at bedtime as needed (acnce, irriated skin). Or as directed    . Umeclidinium-Vilanterol (ANORO ELLIPTA) 62.5-25 MCG/INH AEPB Inhale 1 puff into the lungs daily. 2 each 0   No current facility-administered medications on file prior to visit.

## 2014-04-08 NOTE — Telephone Encounter (Signed)
Per SN---  Ok to refill proair Ok to change the lipitor to crestor 20 mg  1 daily.  thanks

## 2014-04-08 NOTE — Telephone Encounter (Signed)
lmomtcb for pt 

## 2014-04-09 MED ORDER — ROSUVASTATIN CALCIUM 20 MG PO TABS: 20.0000 mg | ORAL_TABLET | Freq: Every day | ORAL | Status: DC

## 2014-04-09 NOTE — Telephone Encounter (Signed)
Pt is aware that we will refill her medications and change Lipitor to Crestor. Rx's have been sent in. Nothing further is needed.

## 2014-04-19 ENCOUNTER — Telehealth: Payer: Self-pay | Admitting: Pulmonary Disease

## 2014-04-19 NOTE — Telephone Encounter (Signed)
Called spoke with pt. She reports she was dx w/ UTI recently. She was in the hospital. Pt had lab work done and urine work done 03/21/14. She started talking about different medications and was very confusing. I asked pt what she needed addressed and reports it was taken care of. i will sign off message

## 2014-05-02 ENCOUNTER — Ambulatory Visit (INDEPENDENT_AMBULATORY_CARE_PROVIDER_SITE_OTHER): Payer: Medicare HMO | Admitting: Internal Medicine

## 2014-05-02 ENCOUNTER — Encounter: Payer: Self-pay | Admitting: Internal Medicine

## 2014-05-02 ENCOUNTER — Telehealth: Payer: Self-pay | Admitting: Pulmonary Disease

## 2014-05-02 VITALS — BP 150/82 | HR 63 | Temp 97.3°F | Resp 16 | Ht 63.0 in | Wt 166.4 lb

## 2014-05-02 DIAGNOSIS — I272 Other secondary pulmonary hypertension: Secondary | ICD-10-CM

## 2014-05-02 DIAGNOSIS — J449 Chronic obstructive pulmonary disease, unspecified: Secondary | ICD-10-CM

## 2014-05-02 DIAGNOSIS — I2723 Pulmonary hypertension due to lung diseases and hypoxia: Secondary | ICD-10-CM

## 2014-05-02 DIAGNOSIS — G894 Chronic pain syndrome: Secondary | ICD-10-CM

## 2014-05-02 DIAGNOSIS — F411 Generalized anxiety disorder: Secondary | ICD-10-CM

## 2014-05-02 MED ORDER — ALBUTEROL SULFATE (2.5 MG/3ML) 0.083% IN NEBU: 2.5000 mg | INHALATION_SOLUTION | Freq: Three times a day (TID) | RESPIRATORY_TRACT | Status: AC

## 2014-05-02 MED ORDER — PREGABALIN 50 MG PO CAPS: 50.0000 mg | ORAL_CAPSULE | Freq: Two times a day (BID) | ORAL | Status: DC | PRN

## 2014-05-02 MED ORDER — ROSUVASTATIN CALCIUM 20 MG PO TABS: 20.0000 mg | ORAL_TABLET | Freq: Every day | ORAL | Status: DC

## 2014-05-02 MED ORDER — UMECLIDINIUM-VILANTEROL 62.5-25 MCG/INH IN AEPB: 1.0000 | INHALATION_SPRAY | Freq: Every day | RESPIRATORY_TRACT | Status: DC

## 2014-05-02 NOTE — Patient Instructions (Signed)
We think that the heart doctor wanted you to take the 1/2 pill of spironolactone every day to keep the fluid off. I would recommend that you schedule a visit with them to discuss this. It is also a blood pressure medicine and yours was a little high today. High blood pressure can put more strain and damage on the heart and lungs.   We still do recommend that you try lyrica for the pain as it is much safer than the hydrocodone you were on before and has a better likelihood to help the nerve pain in your neck from the past injury.   I will send a message to Dr. Lenna Gilford about the breathing medicine to see if he can recommend an alternative to the anoro since it is too expensive. It would also be a good idea to see him again in the next several months.   Fall Prevention and Home Safety Falls cause injuries and can affect all age groups. It is possible to use preventive measures to significantly decrease the likelihood of falls. There are many simple measures which can make your home safer and prevent falls. OUTDOORS  Repair cracks and edges of walkways and driveways.  Remove high doorway thresholds.  Trim shrubbery on the main path into your home.  Have good outside lighting.  Clear walkways of tools, rocks, debris, and clutter.  Check that handrails are not broken and are securely fastened. Both sides of steps should have handrails.  Have leaves, snow, and ice cleared regularly.  Use sand or salt on walkways during winter months.  In the garage, clean up grease or oil spills. BATHROOM  Install night lights.  Install grab bars by the toilet and in the tub and shower.  Use non-skid mats or decals in the tub or shower.  Place a plastic non-slip stool in the shower to sit on, if needed.  Keep floors dry and clean up all water on the floor immediately.  Remove soap buildup in the tub or shower on a regular basis.  Secure bath mats with non-slip, double-sided rug tape.  Remove throw  rugs and tripping hazards from the floors. BEDROOMS  Install night lights.  Make sure a bedside light is easy to reach.  Do not use oversized bedding.  Keep a telephone by your bedside.  Have a firm chair with side arms to use for getting dressed.  Remove throw rugs and tripping hazards from the floor. KITCHEN  Keep handles on pots and pans turned toward the center of the stove. Use back burners when possible.  Clean up spills quickly and allow time for drying.  Avoid walking on wet floors.  Avoid hot utensils and knives.  Position shelves so they are not too high or low.  Place commonly used objects within easy reach.  If necessary, use a sturdy step stool with a grab bar when reaching.  Keep electrical cables out of the way.  Do not use floor polish or wax that makes floors slippery. If you must use wax, use non-skid floor wax.  Remove throw rugs and tripping hazards from the floor. STAIRWAYS  Never leave objects on stairs.  Place handrails on both sides of stairways and use them. Fix any loose handrails. Make sure handrails on both sides of the stairways are as long as the stairs.  Check carpeting to make sure it is firmly attached along stairs. Make repairs to worn or loose carpet promptly.  Avoid placing throw rugs at the top or  bottom of stairways, or properly secure the rug with carpet tape to prevent slippage. Get rid of throw rugs, if possible.  Have an electrician put in a light switch at the top and bottom of the stairs. OTHER FALL PREVENTION TIPS  Wear low-heel or rubber-soled shoes that are supportive and fit well. Wear closed toe shoes.  When using a stepladder, make sure it is fully opened and both spreaders are firmly locked. Do not climb a closed stepladder.  Add color or contrast paint or tape to grab bars and handrails in your home. Place contrasting color strips on first and last steps.  Learn and use mobility aids as needed. Install an  electrical emergency response system.  Turn on lights to avoid dark areas. Replace light bulbs that burn out immediately. Get light switches that glow.  Arrange furniture to create clear pathways. Keep furniture in the same place.  Firmly attach carpet with non-skid or double-sided tape.  Eliminate uneven floor surfaces.  Select a carpet pattern that does not visually hide the edge of steps.  Be aware of all pets. OTHER HOME SAFETY TIPS  Set the water temperature for 120 F (48.8 C).  Keep emergency numbers on or near the telephone.  Keep smoke detectors on every level of the home and near sleeping areas. Document Released: 02/12/2002 Document Revised: 08/24/2011 Document Reviewed: 05/14/2011 [facility] Patient Information 2015 [place], [place]. This information is not intended to replace advice given to you by your health care provider. Make sure you discuss any questions you have with your health care provider.

## 2014-05-02 NOTE — Telephone Encounter (Signed)
Pt needed refill on Anoro. This has been sent in. Nothing further was needed.

## 2014-05-02 NOTE — Progress Notes (Signed)
Pre visit review using our clinic review tool, if applicable. No additional management support is needed unless otherwise documented below in the visit note.

## 2014-05-03 ENCOUNTER — Telehealth: Payer: Self-pay | Admitting: Pulmonary Disease

## 2014-05-03 NOTE — Telephone Encounter (Signed)
Pt is advised to empty the entire vial of albuterol neb solution into her neb machine. Nothing further was needed.

## 2014-05-05 NOTE — Assessment & Plan Note (Signed)
She was wanting information about more forms of portable oxygen and have asked her to schedule a follow up with pulmonary as she is overdue to discuss.

## 2014-05-05 NOTE — Progress Notes (Signed)
   Subjective:    Patient ID: [name] [name], female    DOB: Nov 19, 1950, 64 y.o.   MRN: [id number]  HPI The patient is a 64 YO female who is coming in to follow up on her pain. At our last visit I had advised that she stop taking hydrocodone and xanax and that she try lyrica and cymbalta. She was able to start taking cymbalta but did not try the lyrica as it scared her. She is now still taking the xanax and also temazepam to sleep. She has not been taking hydrocodone (unclear if this is just because she does not have supply). She is still struggling with her pain and rates it about 5-6/10 all the time. Denies new symptoms or worsening or her chronic pain.   Review of Systems  Constitutional: Positive for activity change. Negative for fever, appetite change, fatigue and unexpected weight change.  HENT: Negative.   Eyes: Negative.   Respiratory: Positive for shortness of breath. Negative for cough, chest tightness and wheezing.        With exertion, on O2 all the time  Cardiovascular: Negative for chest pain, palpitations and leg swelling.  Gastrointestinal: Negative for abdominal pain, diarrhea, constipation and abdominal distention.  Musculoskeletal: Positive for myalgias, back pain and arthralgias.  Neurological: Negative for dizziness, weakness and numbness.      Objective:   Physical Exam  Constitutional: She appears well-developed and well-nourished.  HENT:  Head: Normocephalic and atraumatic.  Eyes: EOM are normal.  Neck: Normal range of motion.  Cardiovascular: Normal rate and regular rhythm.   Pulmonary/Chest: Effort normal and breath sounds normal. No respiratory distress. She has no wheezes. She has no rales.  On oxygen  Abdominal: Soft. Bowel sounds are normal.  Neurological: She is alert.  Remote and immediate recall poor.   Skin: Skin is warm and dry.  Psychiatric:  Scattered during the conversation, mildly improved from last visit.    Filed Vitals:   05/02/14 0830 05/02/14 0920  BP: 170/92 150/82  Pulse: 63   Temp: 97.3 F (36.3 C)   TempSrc: Oral   Resp: 16   Height: _0  (1.6 m)   Weight: 166 lb 6.4 oz (75.479 kg)   SpO2: 97%       Assessment & Plan:  Visit time 25 minutes, greater than 50% of which was spent in face to face counseling.

## 2014-05-05 NOTE — Assessment & Plan Note (Signed)
She states no better but the cymbalta must have helped some as she has been able to come off hydrocodone without increase in her pain. 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

## 2014-05-05 NOTE — Assessment & Plan Note (Signed)
Still think that she should be coming off xanax if able now that she is on the cymbalta. Her memory is poor and is not helped by all the sedatives she is taking. Would not combine xanax with temazepam. She is under the care of her psychiatrist and have forwarded Dr. Erling Cruz last note to try to get her amount of xanax down if able. Since that time she has added a second benzodiazepine. Talked with the patient about the dangers of combining two similar medications. Reiterated the risks of fall, memory problems from the benzos. She is not willing to stop either at this time.

## 2014-05-09 ENCOUNTER — Telehealth: Payer: Self-pay | Admitting: Pulmonary Disease

## 2014-05-09 NOTE — Telephone Encounter (Signed)
Order has been received and placed on SN cart for signature.

## 2014-05-14 NOTE — Telephone Encounter (Signed)
Has this been signed and faxed? Thanks!

## 2014-05-15 NOTE — Telephone Encounter (Signed)
Order is in DME folder to be signed by SN.  Once signed these will be faxed back.

## 2014-05-23 ENCOUNTER — Encounter: Payer: Self-pay | Admitting: Pulmonary Disease

## 2014-05-23 ENCOUNTER — Ambulatory Visit (INDEPENDENT_AMBULATORY_CARE_PROVIDER_SITE_OTHER): Payer: Medicare HMO | Admitting: Pulmonary Disease

## 2014-05-23 VITALS — BP 122/72 | HR 69 | Temp 97.2°F | Ht 66.0 in | Wt 166.4 lb

## 2014-05-23 DIAGNOSIS — I2723 Pulmonary hypertension due to lung diseases and hypoxia: Secondary | ICD-10-CM

## 2014-05-23 DIAGNOSIS — K219 Gastro-esophageal reflux disease without esophagitis: Secondary | ICD-10-CM

## 2014-05-23 DIAGNOSIS — Z23 Encounter for immunization: Secondary | ICD-10-CM

## 2014-05-23 DIAGNOSIS — F411 Generalized anxiety disorder: Secondary | ICD-10-CM

## 2014-05-23 DIAGNOSIS — J449 Chronic obstructive pulmonary disease, unspecified: Secondary | ICD-10-CM

## 2014-05-23 DIAGNOSIS — F39 Unspecified mood [affective] disorder: Secondary | ICD-10-CM

## 2014-05-23 DIAGNOSIS — I272 Other secondary pulmonary hypertension: Secondary | ICD-10-CM

## 2014-05-23 DIAGNOSIS — I10 Essential (primary) hypertension: Secondary | ICD-10-CM

## 2014-05-23 DIAGNOSIS — G894 Chronic pain syndrome: Secondary | ICD-10-CM

## 2014-05-23 DIAGNOSIS — J9621 Acute and chronic respiratory failure with hypoxia: Secondary | ICD-10-CM

## 2014-05-23 MED ORDER — UMECLIDINIUM-VILANTEROL 62.5-25 MCG/INH IN AEPB: 1.0000 | INHALATION_SPRAY | Freq: Every day | RESPIRATORY_TRACT | Status: DC

## 2014-05-23 NOTE — Progress Notes (Signed)
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 anxiety.   OXYGEN    2.5 liters 24/7

## 2014-05-23 NOTE — Patient Instructions (Signed)
Today we updated your med list in our EPIC system...    Continue your current medications the same...  Be sure to use the ONORO once daily every day, and the NEBULIZER w/ Albuterol 3-4 times daily...    Use OTC MUCINEX 654m tabs- 2 tabs twice daily w/ fluids...    You may use OTC DELSYM- 2 tsp twice daily as needed for cough...  Continue your OXYGEN at 2-3 L/min continuously (less at rest & more w/ exercise)...  Today we gave you the PREVNAR-13 pneumonia vaccine (one & done!)...  Call for any questions...  Let's plan a follow up visit in [id number] if needed for problems...Marland KitchenMarland Kitchen

## 2014-06-03 ENCOUNTER — Other Ambulatory Visit: Payer: Self-pay

## 2014-06-03 ENCOUNTER — Telehealth (HOSPITAL_COMMUNITY): Payer: Self-pay | Admitting: Vascular Surgery

## 2014-06-03 ENCOUNTER — Telehealth: Payer: Self-pay | Admitting: Pulmonary Disease

## 2014-06-03 MED ORDER — OMEPRAZOLE 20 MG PO CPDR: 20.0000 mg | DELAYED_RELEASE_CAPSULE | Freq: Two times a day (BID) | ORAL | Status: DC

## 2014-06-03 MED ORDER — SPIRONOLACTONE 25 MG PO TABS: 12.5000 mg | ORAL_TABLET | Freq: Every day | ORAL | Status: DC

## 2014-06-03 NOTE — Telephone Encounter (Addendum)
Need new prescription Sprionlactone sent to [facility] in [place]

## 2014-06-03 NOTE — Telephone Encounter (Signed)
Rx has been sent in. Pt is aware. Nothing further was needed. 

## 2014-07-08 ENCOUNTER — Other Ambulatory Visit: Payer: Self-pay | Admitting: Pulmonary Disease

## 2014-07-29 ENCOUNTER — Telehealth: Payer: Self-pay | Admitting: Internal Medicine

## 2014-07-29 NOTE — Telephone Encounter (Signed)
Refill will be discussed at tomorrows ov. Patient may need lab work and based on results, meds may change. Patient aware.

## 2014-07-29 NOTE — Telephone Encounter (Signed)
Patient states that she should have had a generic for lipitor sent to [facility] on [address]. I do not see this on med list. Patient did state that Dr. Leeanne Deed use to prescribe this and that she had spoke with Dr. Doug Sou about getting this script sent in.  Patient does have an appt with Dr. Doug Sou tomorrow 5/24.

## 2014-07-30 ENCOUNTER — Other Ambulatory Visit (INDEPENDENT_AMBULATORY_CARE_PROVIDER_SITE_OTHER): Payer: Medicare HMO

## 2014-07-30 ENCOUNTER — Encounter: Payer: Self-pay | Admitting: Internal Medicine

## 2014-07-30 ENCOUNTER — Ambulatory Visit (INDEPENDENT_AMBULATORY_CARE_PROVIDER_SITE_OTHER): Payer: Medicare HMO | Admitting: Internal Medicine

## 2014-07-30 VITALS — BP 130/76 | HR 65 | Temp 98.4°F | Resp 16 | Ht 63.0 in | Wt 164.0 lb

## 2014-07-30 DIAGNOSIS — I10 Essential (primary) hypertension: Secondary | ICD-10-CM

## 2014-07-30 DIAGNOSIS — M797 Fibromyalgia: Secondary | ICD-10-CM

## 2014-07-30 LAB — COMPREHENSIVE METABOLIC PANEL
ALBUMIN: 4.3 g/dL (ref 3.5–5.2)
ALT: 11 U/L (ref 0–35)
AST: 17 U/L (ref 0–37)
Alkaline Phosphatase: 74 U/L (ref 39–117)
BILIRUBIN TOTAL: 0.4 mg/dL (ref 0.2–1.2)
BUN: 13 mg/dL (ref 6–23)
CALCIUM: 9.3 mg/dL (ref 8.4–10.5)
CO2: 35 mEq/L — ABNORMAL HIGH (ref 19–32)
CREATININE: 0.71 mg/dL (ref 0.40–1.20)
Chloride: 96 mEq/L (ref 96–112)
GFR: 87.46 mL/min (ref >60.00)
GLUCOSE: 98 mg/dL (ref 70–99)
Potassium: 4.6 mEq/L (ref 3.5–5.1)
Sodium: 135 mEq/L (ref 135–145)
TOTAL PROTEIN: 7.1 g/dL (ref 6.0–8.3)

## 2014-07-30 LAB — LDL CHOLESTEROL, DIRECT: LDL DIRECT: 175 mg/dL

## 2014-07-30 LAB — LIPID PANEL
Cholesterol: 297 mg/dL — ABNORMAL HIGH (ref 0–200)
HDL: 45.4 mg/dL (ref >39.00)
Total CHOL/HDL Ratio: 7
Triglycerides: 447 mg/dL — ABNORMAL HIGH (ref 0.0–149.0)

## 2014-07-30 LAB — CBC
HEMATOCRIT: 42 % (ref 36.0–46.0)
HEMOGLOBIN: 14.3 g/dL (ref 12.0–15.0)
MCHC: 33.9 g/dL (ref 30.0–36.0)
MCV: 93.6 fl (ref 78.0–100.0)
Platelets: 239 10*3/uL (ref 150.0–400.0)
RBC: 4.49 Mil/uL (ref 3.87–5.11)
RDW: 13.6 % (ref 11.5–15.5)
WBC: 9.5 10*3/uL (ref 4.0–10.5)

## 2014-07-30 MED ORDER — GABAPENTIN 300 MG PO CAPS: 300.0000 mg | ORAL_CAPSULE | Freq: Three times a day (TID) | ORAL | Status: DC

## 2014-07-30 MED ORDER — ROSUVASTATIN CALCIUM 20 MG PO TABS: 20.0000 mg | ORAL_TABLET | Freq: Every day | ORAL | Status: DC

## 2014-07-30 NOTE — Progress Notes (Signed)
   Subjective:    Patient ID: [name] [name], female    DOB: [birth year], 65 y.o.   MRN: [id number]  HPI The patient is a 64 YO female who is coming in to talk about her fibromyalgia. She was not helped by the lyrica and felt like it made her sleepy. She also thinks it upset her stomach. She would like to try something as she is in pain all the time. Her husband is with her today and mentions that she sleeps all day long and he wants something that will not make her more sleepy. She is still taking the temazepam and the xanax per her psychiatrist despite the fact that I have asked her to cut back for the tiredness. They also inform me that they wish to have a doctor in [place] since they are there more often and it is a burden to come all the way here for visits. They also want to know if they can be reimbursed for a blood pressure cuff that they bought. Her chronic pain she rates 9/10 all the time. The cymbalta has helped and bring it down some.   Review of Systems  Constitutional: Positive for activity change. Negative for fever, appetite change, fatigue and unexpected weight change.  HENT: Negative.   Eyes: Negative.   Respiratory: Positive for shortness of breath. Negative for cough, chest tightness and wheezing.        With exertion, on O2 all the time  Cardiovascular: Negative for chest pain, palpitations and leg swelling.  Gastrointestinal: Negative for abdominal pain, diarrhea, constipation and abdominal distention.  Musculoskeletal: Positive for myalgias, back pain and arthralgias.  Neurological: Negative for dizziness, weakness and numbness.      Objective:   Physical Exam  Constitutional: She appears well-developed and well-nourished.  HENT:  Head: Normocephalic and atraumatic.  Eyes: EOM are normal.  Neck: Normal range of motion.  Cardiovascular: Normal rate and regular rhythm.   Pulmonary/Chest: Effort normal and breath sounds normal. No respiratory distress. She  has no wheezes. She has no rales.  On oxygen  Abdominal: Soft. Bowel sounds are normal.  Neurological: She is alert.  Remote and immediate recall poor.   Skin: Skin is warm and dry.   Filed Vitals:   07/30/14 1132  BP: 130/76  Pulse: 65  Temp: 98.4 F (36.9 C)  TempSrc: Oral  Resp: 16  Height: _0  (1.6 m)  Weight: 164 lb (74.39 kg)  SpO2: 92%      Assessment & Plan:

## 2014-07-30 NOTE — Assessment & Plan Note (Signed)
She did not tolerate lyrica and will try gabapentin. Will titrate up fairly quickly for the pain. She is still also taking cymbalta 60 mg daily and will continue. Would not rx narcotics since she is already so sleepy all the time and is at high risk for overdose with 2 benzos on board at this time (have asked her to stop on several occasions and forwarded notes to her psychiatrist to try to stop but to no avail).

## 2014-07-30 NOTE — Patient Instructions (Signed)
We have sent in gabapentin which is a medicine for nerve pain. You need to start taking it slowly and can take 1 pill a day for the first 3 days. Then you can increase to taking 1 pill twice a day if you need it. After doing that for 3 days you can increase to taking 1 pill 3 times a day for pain. It may take a couple days to start working.   Let us know if you find a new doctor and we will make sure your records are transferred.

## 2014-07-30 NOTE — Assessment & Plan Note (Signed)
BP well controlled on lopressor and spironolactone. They did buy a BP cuff so they could monitor at home. Continue and check BMP today.

## 2014-07-30 NOTE — Progress Notes (Signed)
Pre visit review using our clinic review tool, if applicable. No additional management support is needed unless otherwise documented below in the visit note.

## 2014-08-02 ENCOUNTER — Telehealth: Payer: Self-pay | Admitting: Internal Medicine

## 2014-08-02 NOTE — Telephone Encounter (Signed)
Patient returned your call.

## 2014-08-06 ENCOUNTER — Telehealth: Payer: Self-pay | Admitting: Pulmonary Disease

## 2014-08-06 NOTE — Telephone Encounter (Signed)
Spoke with pt, she is aware of SN's recs.  Nothing further needed at this time.

## 2014-08-06 NOTE — Telephone Encounter (Signed)
Per SN, please advise pt that there is nothing he can do to help and that she needs to f/u with her PCP, Dr Doug Sou for a refill on pain medicine. If PCP can not handle pain than PCP needs to refer to either rheumatology or a chronic pain specialist. Please advise pt. Thanks

## 2014-08-06 NOTE — Telephone Encounter (Signed)
Left message for patient to call me back.

## 2014-08-06 NOTE — Telephone Encounter (Signed)
Pt calling back [phone]

## 2014-08-06 NOTE — Telephone Encounter (Signed)
lmtcb.

## 2014-08-06 NOTE — Telephone Encounter (Signed)
Patient having a lot of pain.  Patient says she is doing great, but she is hurting really bad.  The Lyrica is making her mouth swell and tongue swells.  She feels useless.  [place]  Patient says that if she needs to come in for an appointment to discuss this, she is more than happy to bring her husband and come in to discuss when they get back in town.  She wants prescription for pain medication.   No Known Allergies Current Outpatient Prescriptions on File Prior to Visit  Medication Sig Dispense Refill  . albuterol (PROAIR HFA) 108 (90 BASE) MCG/ACT inhaler Inhale 2 puffs into the lungs every 4 (four) hours as needed for wheezing. 1 each 5  . albuterol (PROVENTIL) (2.5 MG/3ML) 0.083% nebulizer solution Take 3 mLs (2.5 mg total) by nebulization 3 (three) times daily. DX 493.20 360 mL 3  . ALPRAZolam (XANAX) 1 MG tablet Use as directed by psychiatrist    . Cholecalciferol (VITAMIN D) 2000 UNITS CAPS Take 2 capsules by mouth daily.     . DULoxetine (CYMBALTA) 30 MG capsule Take 1 capsule by mouth daily.     Marland Kitchen gabapentin (NEURONTIN) 300 MG capsule Take 1 capsule (300 mg total) by mouth 3 (three) times daily. 90 capsule 3  . metoprolol (LOPRESSOR) 50 MG tablet TAKE ONE TABLET BY MOUTH TWICE DAILY 60 tablet 1  . omeprazole (PRILOSEC) 20 MG capsule Take 1 capsule (20 mg total) by mouth 2 (two) times daily. 180 capsule 1  . rosuvastatin (CRESTOR) 20 MG tablet Take 1 tablet (20 mg total) by mouth daily. 90 tablet 3  . spironolactone (ALDACTONE) 25 MG tablet Take 0.5 tablets (12.5 mg total) by mouth daily. 15 tablet 3  . tretinoin (RETIN-A) 0.025 % cream Apply 1 application topically at bedtime as needed (acnce, irriated skin). Or as directed    . Umeclidinium-Vilanterol (ANORO ELLIPTA) 62.5-25 MCG/INH AEPB Inhale 1 puff into the lungs daily. 60 each 2   No current facility-administered medications on file prior to visit.

## 2014-09-20 ENCOUNTER — Telehealth: Payer: Self-pay | Admitting: Pulmonary Disease

## 2014-09-20 NOTE — Telephone Encounter (Signed)
lmomtcb x1 for pt

## 2014-09-23 ENCOUNTER — Ambulatory Visit: Payer: Medicare HMO | Admitting: Pulmonary Disease

## 2014-09-23 MED ORDER — METOPROLOL SUCCINATE ER 50 MG PO TB24: 50.0000 mg | ORAL_TABLET | Freq: Every day | ORAL | Status: DC

## 2014-09-23 NOTE — Telephone Encounter (Signed)
Per SN- Ok to change to Metoprolol Succinate 6m ER, to take 1 tab qd, #30.  Called and spoke to pt. Informed her of the change. Rx sent to preferred pharmacy. Pt verbalized understanding and denied any further questions or concerns at this time.

## 2014-09-23 NOTE — Telephone Encounter (Signed)
Spoke with pt, states that she is having increased sleepiness with her new metopolol bid.  Pt states she has cut back her meds to 1 tab qd d/t her increased sleepiness.  Pt does not regularly check her BP but states she feels well.  Pt states that one of her providers had called her pharmacy to check on her blood pressure meds- I advised that I see no documentation where our office has called her pharmacy to follow up on her meds.  Pt wants to make sure that SN knows that she's changed her dosage, and to let her know if she needs to go back to taking 1 tab bid.    Dr. [facility]

## 2014-10-02 ENCOUNTER — Telehealth: Payer: Self-pay | Admitting: Internal Medicine

## 2014-10-02 NOTE — Telephone Encounter (Signed)
Patient Name: [name] [name] DOB: 27-May-1950 Initial Comment Caller states she has questions about her dehydration. PT sees Dr. Nurse Assessment Nurse: Marcelline Deist, RN, Kermit Balo Date/Time Eilene Ghazi Time): 10/02/2014 1:21:59 PM Confirm and document reason for call. If symptomatic, describe symptoms. ---Caller states she has questions about dehydration. In the beginning of the year, she had a UTI, was talking out of her head at that time. Has pulmonary hypertension, on O2. Has problems with her memory d/t a fall some time ago, doesn't think it was a medication related issue as far as an overdose. She can tell something is different, was in house without air condition for 5 days. Is drinking fluid, but feels weak. Has the patient traveled out of the country within the last 30 days? ---Not Applicable Does the patient require triage? ---Yes Related visit to physician within the last 2 weeks? ---No Does the PT have any chronic conditions? (i.e. diabetes, asthma, etc.) ---Yes List chronic conditions. ---fibromyalgia, pulm. hypertension, on O2 Guidelines Guideline Title Affirmed Question Affirmed Notes Weakness (Generalized) and Fatigue Taking a medicine that could cause weakness (e.g., blood pressure medications, diuretics) Final Disposition User See Physician within [place], RN, Kermit Balo Comments Caller states she feels her symptoms have resolved, but was concerned that she might be dehydrated. Wondering what the symptoms are & what she should do to prevent dehydration. Does not feel she needs to be seen at this time. Referrals REFERRED TO PCP OFFICE Disagree/Comply: Disagree Disagree/Comply Reason: Disagree with instructions

## 2014-11-26 ENCOUNTER — Telehealth: Payer: Self-pay | Admitting: Internal Medicine

## 2014-11-26 MED ORDER — METOPROLOL SUCCINATE ER 50 MG PO TB24: 50.0000 mg | ORAL_TABLET | Freq: Every day | ORAL | Status: DC

## 2014-11-26 NOTE — Telephone Encounter (Signed)
Refill sent to [facility]...[name] [name]

## 2014-11-26 NOTE — Telephone Encounter (Signed)
Patient requesting a 30 day refill for metoprolol succinate (TOPROL-XL) 50 MG 24 hr tablet [[id number]]  Dr. Lenna Gilford is the provider that wrote this but she can't get ahold of him. Pharmacy is [facility] in [place]

## 2014-12-03 ENCOUNTER — Other Ambulatory Visit (HOSPITAL_COMMUNITY): Payer: Self-pay | Admitting: *Deleted

## 2014-12-03 MED ORDER — SPIRONOLACTONE 25 MG PO TABS: 12.5000 mg | ORAL_TABLET | Freq: Every day | ORAL | Status: AC

## 2014-12-16 ENCOUNTER — Encounter: Payer: Self-pay | Admitting: Internal Medicine

## 2014-12-16 ENCOUNTER — Ambulatory Visit (INDEPENDENT_AMBULATORY_CARE_PROVIDER_SITE_OTHER): Payer: Medicare HMO | Admitting: Internal Medicine

## 2014-12-16 ENCOUNTER — Other Ambulatory Visit (INDEPENDENT_AMBULATORY_CARE_PROVIDER_SITE_OTHER): Payer: Medicare HMO

## 2014-12-16 VITALS — BP 158/98 | HR 98 | Temp 98.1°F | Resp 18 | Ht 63.0 in | Wt 164.1 lb

## 2014-12-16 DIAGNOSIS — M797 Fibromyalgia: Secondary | ICD-10-CM

## 2014-12-16 DIAGNOSIS — I10 Essential (primary) hypertension: Secondary | ICD-10-CM | POA: Diagnosis not present

## 2014-12-16 DIAGNOSIS — E785 Hyperlipidemia, unspecified: Secondary | ICD-10-CM | POA: Diagnosis not present

## 2014-12-16 DIAGNOSIS — Z23 Encounter for immunization: Secondary | ICD-10-CM

## 2014-12-16 LAB — LIPID PANEL
CHOL/HDL RATIO: 4
CHOLESTEROL: 191 mg/dL (ref 0–200)
HDL: 45.6 mg/dL (ref >39.00)
NonHDL: 145.02
Triglycerides: 312 mg/dL — ABNORMAL HIGH (ref 0.0–149.0)
VLDL: 62.4 mg/dL — ABNORMAL HIGH (ref 0.0–40.0)

## 2014-12-16 LAB — LDL CHOLESTEROL, DIRECT: LDL DIRECT: 111 mg/dL

## 2014-12-16 MED ORDER — OMEPRAZOLE 20 MG PO CPDR: 20.0000 mg | DELAYED_RELEASE_CAPSULE | Freq: Two times a day (BID) | ORAL | Status: DC

## 2014-12-16 NOTE — Progress Notes (Signed)
Pre visit review using our clinic review tool, if applicable. No additional management support is needed unless otherwise documented below in the visit note.

## 2014-12-16 NOTE — Patient Instructions (Addendum)
With the lyrica we would like you to take 50 mg twice a day for 3 days, if you are not feeling better double the dose and take 2 pills (100 mg) twice a day to see if this helps more. The most common helpful dose is 150-200 mg twice a day so even the 2 pills twice a day may not be enough to help.   Remember that exercise is good for the body and to help with the pain if you are able. Even water aerobics is good on the joints and can be therapeutic with warm water on your muscles. Many [employer] have classes for this and generally your insurance will cover a gym for you.

## 2014-12-18 ENCOUNTER — Encounter: Payer: Self-pay | Admitting: Internal Medicine

## 2014-12-18 NOTE — Assessment & Plan Note (Signed)
Pain increased today due to no gabapentin and not taking the lyrica. Will watch at next visit as she previously has been well controlled on her toprol-xl and spironolactone. At last visit she was taking lopressor BID which was switched by her pulmonologist since last visit.

## 2014-12-18 NOTE — Assessment & Plan Note (Signed)
Have advised her to start taking the lyrica 50 mg BID and then increase to 100 mg BID if not effective after 2 days. We talked about the general effective dose 150-200 mg BID so may still need increase. Would avoid narcotics for this pain as she already has severe respiratory problems and is on several sedating medications from psych that do tend to make her sleepy during our visits.

## 2014-12-18 NOTE — Progress Notes (Signed)
Subjective:    Patient ID: [name] [name], female    DOB: [birth year], 64 y.o.   MRN: [id number]  HPI The patient is a 64 YO female coming in for her fibromyalgia and pain. She is not doing well and has not been able to use lyrica since last visit. She did fill it at one point but then lost it. She started taking it 1 day ago and has not noticed much yet. She is still taking her cymbalta 30 mg daily and her psych tried to increase it at last visit which she did not do well with (there were several medication changes at once). She is not exercising at all. In pain with multiple sites and has to manage that to get anything done.   Review of Systems  Constitutional: Positive for activity change. Negative for fever, appetite change, fatigue and unexpected weight change.  HENT: Negative.   Eyes: Negative.   Respiratory: Positive for shortness of breath. Negative for cough, chest tightness and wheezing.        With exertion, on O2 all the time  Cardiovascular: Negative for chest pain, palpitations and leg swelling.  Gastrointestinal: Negative for abdominal pain, diarrhea, constipation and abdominal distention.  Musculoskeletal: Positive for myalgias, back pain and arthralgias.  Skin: Negative.   Neurological: Negative for dizziness, weakness and numbness.      Objective:   Physical Exam  Constitutional: She appears well-developed and well-nourished.  HENT:  Head: Normocephalic and atraumatic.  Eyes: EOM are normal.  Neck: Normal range of motion.  Cardiovascular: Normal rate and regular rhythm.   Pulmonary/Chest: Effort normal and breath sounds normal. No respiratory distress. She has no wheezes. She has no rales.  On oxygen  Abdominal: Soft. Bowel sounds are normal.  Neurological: She is alert.  Skin: Skin is warm and dry.   Filed Vitals:   12/16/14 1310  BP: 158/98  Pulse: 98  Temp: 98.1 F (36.7 C)  TempSrc: Oral  Resp: 18  Height: 5' 3" (1.6 m)  Weight: 164 lb 1.9  oz (74.444 kg)  SpO2: 90%      Assessment & Plan:  Flu shot given at visit.

## 2014-12-24 ENCOUNTER — Telehealth: Payer: Self-pay | Admitting: *Deleted

## 2014-12-24 MED ORDER — PREGABALIN 75 MG PO CAPS: 75.0000 mg | ORAL_CAPSULE | Freq: Two times a day (BID) | ORAL | Status: DC

## 2014-12-24 MED ORDER — ROSUVASTATIN CALCIUM 20 MG PO TABS
20.0000 mg | ORAL_TABLET | Freq: Every day | ORAL | Status: DC
Start: 2014-12-24 — End: 2015-06-16

## 2014-12-24 NOTE — Telephone Encounter (Signed)
Printed please fax.

## 2014-12-24 NOTE — Telephone Encounter (Signed)
Left msg on triage stating saw md last week forgot to get rx for her Lyrica.Marland KitchenJohny Chess

## 2014-12-24 NOTE — Telephone Encounter (Signed)
Called pt to verify pharmacy no answer [facility] RTC with pharmacy info...[name] [name]

## 2014-12-24 NOTE — Addendum Note (Signed)
Addended by: Earnstine Regal on: 12/24/2014 02:34 PM   Modules accepted: Orders

## 2014-12-24 NOTE — Telephone Encounter (Signed)
Pt return call back needing rx sent to [facility] in [place]...[name] [name]

## 2015-02-13 ENCOUNTER — Telehealth: Payer: Self-pay | Admitting: Internal Medicine

## 2015-02-13 NOTE — Telephone Encounter (Signed)
I called and spoke with patient and informed her that Dr. Sharlet Salina does not oversee her alprazolam and she has to have it filled through her psychiatrist. She was very mad and hung up on me.

## 2015-02-13 NOTE — Telephone Encounter (Signed)
Pt wants to speak to the assistant concern about refill for Alprazolam, pt stated that the doctor that normally give this med to is not there and she out of for a week now. Please give her a call back  Phone # [phone]

## 2015-02-13 NOTE — Telephone Encounter (Signed)
We are not prescribing this for her and she will need to communicate with her psychiatrist's office.

## 2015-06-01 ENCOUNTER — Other Ambulatory Visit: Payer: Self-pay | Admitting: Internal Medicine

## 2015-06-16 ENCOUNTER — Encounter: Payer: Self-pay | Admitting: Internal Medicine

## 2015-06-16 ENCOUNTER — Ambulatory Visit (INDEPENDENT_AMBULATORY_CARE_PROVIDER_SITE_OTHER): Payer: Medicare Other | Admitting: Internal Medicine

## 2015-06-16 ENCOUNTER — Other Ambulatory Visit (INDEPENDENT_AMBULATORY_CARE_PROVIDER_SITE_OTHER): Payer: Medicare Other

## 2015-06-16 VITALS — BP 148/90 | HR 73 | Temp 98.3°F | Resp 14 | Ht 63.0 in | Wt 162.4 lb

## 2015-06-16 DIAGNOSIS — I272 Other secondary pulmonary hypertension: Secondary | ICD-10-CM

## 2015-06-16 DIAGNOSIS — I10 Essential (primary) hypertension: Secondary | ICD-10-CM

## 2015-06-16 DIAGNOSIS — E785 Hyperlipidemia, unspecified: Secondary | ICD-10-CM

## 2015-06-16 DIAGNOSIS — J449 Chronic obstructive pulmonary disease, unspecified: Secondary | ICD-10-CM

## 2015-06-16 DIAGNOSIS — F411 Generalized anxiety disorder: Secondary | ICD-10-CM | POA: Diagnosis not present

## 2015-06-16 DIAGNOSIS — I2723 Pulmonary hypertension due to lung diseases and hypoxia: Secondary | ICD-10-CM

## 2015-06-16 DIAGNOSIS — J45909 Unspecified asthma, uncomplicated: Secondary | ICD-10-CM

## 2015-06-16 LAB — COMPREHENSIVE METABOLIC PANEL
ALK PHOS: 76 U/L (ref 39–117)
ALT: 11 U/L (ref 0–35)
AST: 16 U/L (ref 0–37)
Albumin: 4 g/dL (ref 3.5–5.2)
BUN: 11 mg/dL (ref 6–23)
CO2: 37 meq/L — AB (ref 19–32)
Calcium: 9.5 mg/dL (ref 8.4–10.5)
Chloride: 97 mEq/L (ref 96–112)
Creatinine, Ser: 0.62 mg/dL (ref 0.40–1.20)
GFR: 102 mL/min (ref >60.00)
GLUCOSE: 95 mg/dL (ref 70–99)
POTASSIUM: 4.3 meq/L (ref 3.5–5.1)
Sodium: 138 mEq/L (ref 135–145)
TOTAL PROTEIN: 7 g/dL (ref 6.0–8.3)
Total Bilirubin: 0.5 mg/dL (ref 0.2–1.2)

## 2015-06-16 LAB — LIPID PANEL
Cholesterol: 287 mg/dL — ABNORMAL HIGH (ref 0–200)
HDL: 43.4 mg/dL (ref >39.00)
NonHDL: 243.4
Total CHOL/HDL Ratio: 7
Triglycerides: 227 mg/dL — ABNORMAL HIGH (ref 0.0–149.0)
VLDL: 45.4 mg/dL — AB (ref 0.0–40.0)

## 2015-06-16 LAB — LDL CHOLESTEROL, DIRECT: LDL DIRECT: 204 mg/dL

## 2015-06-16 LAB — HEMOGLOBIN A1C: HEMOGLOBIN A1C: 5.9 % (ref 4.6–6.5)

## 2015-06-16 MED ORDER — OMEPRAZOLE 20 MG PO CPDR: 20.0000 mg | DELAYED_RELEASE_CAPSULE | Freq: Two times a day (BID) | ORAL | Status: AC

## 2015-06-16 MED ORDER — NALTREXONE-BUPROPION HCL ER 8-90 MG PO TB12: ORAL_TABLET | ORAL | Status: AC

## 2015-06-16 MED ORDER — METOPROLOL SUCCINATE ER 50 MG PO TB24: 50.0000 mg | ORAL_TABLET | Freq: Every day | ORAL | Status: DC

## 2015-06-16 MED ORDER — ROSUVASTATIN CALCIUM 20 MG PO TABS: 20.0000 mg | ORAL_TABLET | Freq: Every day | ORAL | Status: AC

## 2015-06-16 NOTE — Progress Notes (Signed)
   Subjective:    Patient ID: [name] [name], female    DOB: 1950/08/19, 65 y.o.   MRN: [id number]  HPI The patient is a 65 YO female coming in for follow up of her medical problems including her COPD (has weaned herself off her meds since last visit, doing okay, uses oxygen occasionally, sometimes checks oxygen level and in the high 70s, not smoking anymore, rare albuterol usage, better stamina), her anxiety (stopped xanax herself in the last 3-4 months and is feeling much better, she feels her breathing is better, mood has been volatile but okay), and her blood pressure (taking her medicine and no headaches or chest pains, taken her meds today, BP at goal at home). She is not taking her cholesterol medicine right now.   Review of Systems  Constitutional: Negative for fever, activity change, appetite change, fatigue and unexpected weight change.  Respiratory: Positive for shortness of breath. Negative for cough, chest tightness and wheezing.        With exertion, on O2 all the time  Cardiovascular: Negative for chest pain, palpitations and leg swelling.  Gastrointestinal: Negative for abdominal pain, diarrhea, constipation and abdominal distention.  Musculoskeletal: Positive for arthralgias. Negative for myalgias and back pain.  Skin: Negative.   Neurological: Negative for dizziness, weakness and numbness.  Psychiatric/Behavioral:       Some volatile moods      Objective:   Physical Exam  Constitutional: She appears well-developed and well-nourished.  HENT:  Head: Normocephalic and atraumatic.  Eyes: EOM are normal.  Neck: Normal range of motion.  Cardiovascular: Normal rate and regular rhythm.   Pulmonary/Chest: Effort normal and breath sounds normal. No respiratory distress. She has no wheezes. She has no rales.  Oxygen with her but not using during visit  Abdominal: Soft. Bowel sounds are normal.  Neurological: She is alert.  Skin: Skin is warm and dry.   Filed Vitals:     06/16/15 0823 06/16/15 0907  BP: 182/100 148/90  Pulse: 73   Temp: 98.3 F (36.8 C)   TempSrc: Oral   Resp: 14   Height: _0  (1.6 m)   Weight: 162 lb 6.4 oz (73.664 kg)   SpO2: 96%       Assessment & Plan:

## 2015-06-16 NOTE — Assessment & Plan Note (Signed)
I did remind her that her low oxygen levels put strain on her heart and she should use the oxygen when she is not at rest to help prevent more damage but she does not want to use it all the time.

## 2015-06-16 NOTE — Assessment & Plan Note (Signed)
BP is close to goal on her spironolactone and metoprolol. Checking CMP and adjust as needed. Talked to her about lowering salt intake and increasing her exercise.

## 2015-06-16 NOTE — Assessment & Plan Note (Signed)
No flare but not using her oxygen as she should. Reminded her to use with exertion. She does not wish to resume her medications at this time.

## 2015-06-16 NOTE — Progress Notes (Signed)
Pre visit review using our clinic review tool, if applicable. No additional management support is needed unless otherwise documented below in the visit note.

## 2015-06-16 NOTE — Patient Instructions (Signed)
We are checking the labs today and have given you the prescription for the weight medicine contrave.   Week 1 take 1 pill in the morning. Week 2 take 1 pill twice a day. Week 3 take 2 pills in the morning and 1 pill at night. Week 4 and beyond take 2 pills in the morning and 2 pills at night.   Come back in about 6 months for a physical.   Exercising to Stay Healthy Exercising regularly is important. It has many health benefits, such as:  Improving your overall fitness, flexibility, and endurance.  Increasing your bone density.  Helping with weight control.  Decreasing your body fat.  Increasing your muscle strength.  Reducing stress and tension.  Improving your overall health. In order to become healthy and stay healthy, it is recommended that you do moderate-intensity and vigorous-intensity exercise. You can tell that you are exercising at a moderate intensity if you have a higher heart rate and faster breathing, but you are still able to hold a conversation. You can tell that you are exercising at a vigorous intensity if you are breathing much harder and faster and cannot hold a conversation while exercising. HOW OFTEN SHOULD I EXERCISE? Choose an activity that you enjoy and set realistic goals. Your health care provider can help you to make an activity plan that works for you. Exercise regularly as directed by your health care provider. This may include:   Doing resistance training twice each week, such as:  Push-ups.  Sit-ups.  Lifting weights.  Using resistance bands.  Doing a given intensity of exercise for a given amount of time. Choose from these options:  150 minutes of moderate-intensity exercise every week.  75 minutes of vigorous-intensity exercise every week.  A mix of moderate-intensity and vigorous-intensity exercise every week. Children, pregnant women, people who are out of shape, people who are overweight, and older adults may need to consult a health  care provider for individual recommendations. If you have any sort of medical condition, be sure to consult your health care provider before starting a new exercise program.  WHAT ARE SOME EXERCISE IDEAS? Some moderate-intensity exercise ideas include:   Walking at a rate of 1 mile in 15 minutes.  Biking.  Hiking.  Golfing.  Dancing. Some vigorous-intensity exercise ideas include:   Walking at a rate of at least 4.5 miles per hour.  Jogging or running at a rate of 5 miles per hour.  Biking at a rate of at least 10 miles per hour.  Lap swimming.  Roller-skating or in-line skating.  Cross-country skiing.  Vigorous competitive sports, such as football, basketball, and soccer.  Jumping rope.  Aerobic dancing. WHAT ARE SOME EVERYDAY ACTIVITIES THAT CAN HELP ME TO GET EXERCISE?  Yard work, such as:  Psychologist, educational.  Raking and bagging leaves.  Washing and waxing your car.  Pushing a stroller.  Shoveling snow.  Gardening.  Washing windows or floors. HOW CAN I BE MORE ACTIVE IN MY DAY-TO-DAY ACTIVITIES?  Use the stairs instead of the elevator.  Take a walk during your lunch break.  If you drive, park your car farther away from work or school.  If you take public transportation, get off one stop early and walk the rest of the way.  Make all of your phone calls while standing up and walking around.  Get up, stretch, and walk around every 30 minutes throughout the day. WHAT GUIDELINES SHOULD I FOLLOW WHILE EXERCISING?  Do not  exercise so much that you hurt yourself, feel dizzy, or get very short of breath.  Consult your health care provider before starting a new exercise program.  Wear comfortable clothes and shoes with good support.  Drink plenty of water while you exercise to prevent dehydration or heat stroke. Body water is lost during exercise and must be replaced.  Work out until you breathe faster and your heart beats faster.   This  information is not intended to replace advice given to you by your health care provider. Make sure you discuss any questions you have with your health care provider.   Document Released: 03/27/2010 Document Revised: 03/15/2014 Document Reviewed: 07/26/2013 Elsevier Interactive Patient Education [employer].

## 2015-06-16 NOTE — Assessment & Plan Note (Signed)
She is now off xanax per my advice and she is much more animated during the visit (previously with some confusion and drowsiness during the visit). Congratulated her and advised her to stay off permanently. She is in agreement. Her breathing is doing better off xanax.

## 2015-06-23 ENCOUNTER — Other Ambulatory Visit: Payer: Self-pay | Admitting: Internal Medicine

## 2015-12-16 ENCOUNTER — Ambulatory Visit (INDEPENDENT_AMBULATORY_CARE_PROVIDER_SITE_OTHER): Payer: Medicare Other | Admitting: Internal Medicine

## 2015-12-16 ENCOUNTER — Encounter: Payer: Self-pay | Admitting: Internal Medicine

## 2015-12-16 ENCOUNTER — Other Ambulatory Visit (INDEPENDENT_AMBULATORY_CARE_PROVIDER_SITE_OTHER): Payer: Medicare Other

## 2015-12-16 VITALS — BP 174/94 | HR 74 | Temp 98.3°F | Resp 16 | Ht 63.0 in | Wt 162.0 lb

## 2015-12-16 DIAGNOSIS — J449 Chronic obstructive pulmonary disease, unspecified: Secondary | ICD-10-CM | POA: Diagnosis not present

## 2015-12-16 DIAGNOSIS — I10 Essential (primary) hypertension: Secondary | ICD-10-CM

## 2015-12-16 DIAGNOSIS — E785 Hyperlipidemia, unspecified: Secondary | ICD-10-CM

## 2015-12-16 LAB — COMPREHENSIVE METABOLIC PANEL
ALT: 9 U/L (ref 0–35)
AST: 13 U/L (ref 0–37)
Albumin: 4 g/dL (ref 3.5–5.2)
Alkaline Phosphatase: 74 U/L (ref 39–117)
BUN: 14 mg/dL (ref 6–23)
CHLORIDE: 94 meq/L — AB (ref 96–112)
CO2: 37 mEq/L — ABNORMAL HIGH (ref 19–32)
Calcium: 9.6 mg/dL (ref 8.4–10.5)
Creatinine, Ser: 0.82 mg/dL (ref 0.40–1.20)
GFR: 73.76 mL/min (ref >60.00)
GLUCOSE: 116 mg/dL — AB (ref 70–99)
POTASSIUM: 3.7 meq/L (ref 3.5–5.1)
Sodium: 138 mEq/L (ref 135–145)
TOTAL PROTEIN: 7 g/dL (ref 6.0–8.3)
Total Bilirubin: 0.4 mg/dL (ref 0.2–1.2)

## 2015-12-16 LAB — LDL CHOLESTEROL, DIRECT: Direct LDL: 177 mg/dL

## 2015-12-16 LAB — LIPID PANEL
CHOL/HDL RATIO: 5
Cholesterol: 251 mg/dL — ABNORMAL HIGH (ref 0–200)
HDL: 48.3 mg/dL (ref >39.00)
NONHDL: 202.23
TRIGLYCERIDES: 238 mg/dL — AB (ref 0.0–149.0)
VLDL: 47.6 mg/dL — ABNORMAL HIGH (ref 0.0–40.0)

## 2015-12-16 LAB — CBC
HEMATOCRIT: 42.8 % (ref 36.0–46.0)
HEMOGLOBIN: 14.5 g/dL (ref 12.0–15.0)
MCHC: 33.8 g/dL (ref 30.0–36.0)
MCV: 92.1 fl (ref 78.0–100.0)
Platelets: 279 10*3/uL (ref 150.0–400.0)
RBC: 4.65 Mil/uL (ref 3.87–5.11)
RDW: 13.3 % (ref 11.5–15.5)
WBC: 8.8 10*3/uL (ref 4.0–10.5)

## 2015-12-16 MED ORDER — DOXYCYCLINE HYCLATE 100 MG PO TABS: 100.0000 mg | ORAL_TABLET | Freq: Two times a day (BID) | ORAL | 0 refills | Status: AC

## 2015-12-16 NOTE — Patient Instructions (Addendum)
We are checking the blood work today and will call you back with the results.   We have sent in doxycycline for the sinuses today. Take 1 pill twice a day for 10 days. We also recommend to start taking zyrtec (cetirizine) 10 mg daily for the next 1-2 weeks.

## 2015-12-16 NOTE — Progress Notes (Signed)
Pre visit review using our clinic review tool, if applicable. No additional management support is needed unless otherwise documented below in the visit note.

## 2015-12-16 NOTE — Progress Notes (Signed)
   Subjective:    Patient ID: [name] [name], female    DOB: 12-22-1950, 65 y.o.   MRN: [id number]  HPI The patient is a 65 YO female coming in for sinus pressure and pain for about 3-4 weeks. She is taking sudafed over that time as well as different cold and sinus medications. She is not taking any allergy medicine or nose sprays as she has heard you can get addicted to them. She denies fevers or chills. Slightly more SOB and using albuterol a little bit more. No cough with production. No chest pains. Some ear popping. Nose drainage with mostly yellow or white. No sore throat.   Review of Systems  Constitutional: Negative for activity change, appetite change, fatigue, fever and unexpected weight change.  HENT: Positive for congestion, postnasal drip, rhinorrhea and sinus pressure. Negative for ear discharge, ear pain, sore throat and trouble swallowing.   Eyes: Negative.   Respiratory: Positive for shortness of breath. Negative for cough, chest tightness and wheezing.   Cardiovascular: Negative for chest pain, palpitations and leg swelling.  Gastrointestinal: Negative.   Musculoskeletal: Negative.   Neurological: Negative.       Objective:   Physical Exam  Constitutional: She is oriented to person, place, and time. She appears well-developed and well-nourished.  HENT:  Head: Normocephalic and atraumatic.  Oropharynx with redness and clear drainage, nose with mild crusting, TM normal bilaterally.   Eyes: EOM are normal.  Neck: Normal range of motion. No JVD present.  Shotty CAD  Cardiovascular: Normal rate and regular rhythm.   Pulmonary/Chest: Effort normal. No respiratory distress. She has no wheezes. She has no rales.  Some increased rhonchi bilaterally which partially clears with cough  Abdominal: Soft.  Neurological: She is alert and oriented to person, place, and time.  Skin: Skin is warm and dry.   Vitals:   12/16/15 0806 12/16/15 0828  BP: (!) 170/84 (!) 174/94    Pulse: 74   Resp: 16   Temp: 98.3 F (36.8 C)   TempSrc: Oral   SpO2: 92%   Weight: 162 lb (73.5 kg)   Height: _0  (1.6 m)       Assessment & Plan:

## 2015-12-16 NOTE — Assessment & Plan Note (Signed)
Rx for doxycycline for the increased rhonchi and sinus symptoms for 3 weeks.

## 2015-12-16 NOTE — Assessment & Plan Note (Signed)
BP elevated due to sudafed and she is asked to take zyrtec instead. Prior BP at goal on regimen so will monitor.

## 2016-01-09 IMAGING — CR DG CHEST 2V
2 series · 2 of 2 positions shown · non-contrast
Comparison: Chest x-ray of 08/08/2013

CLINICAL DATA: History of COPD a recent hallucinations and
confusion

EXAM:
CHEST  2 VIEW

[w chest pa]
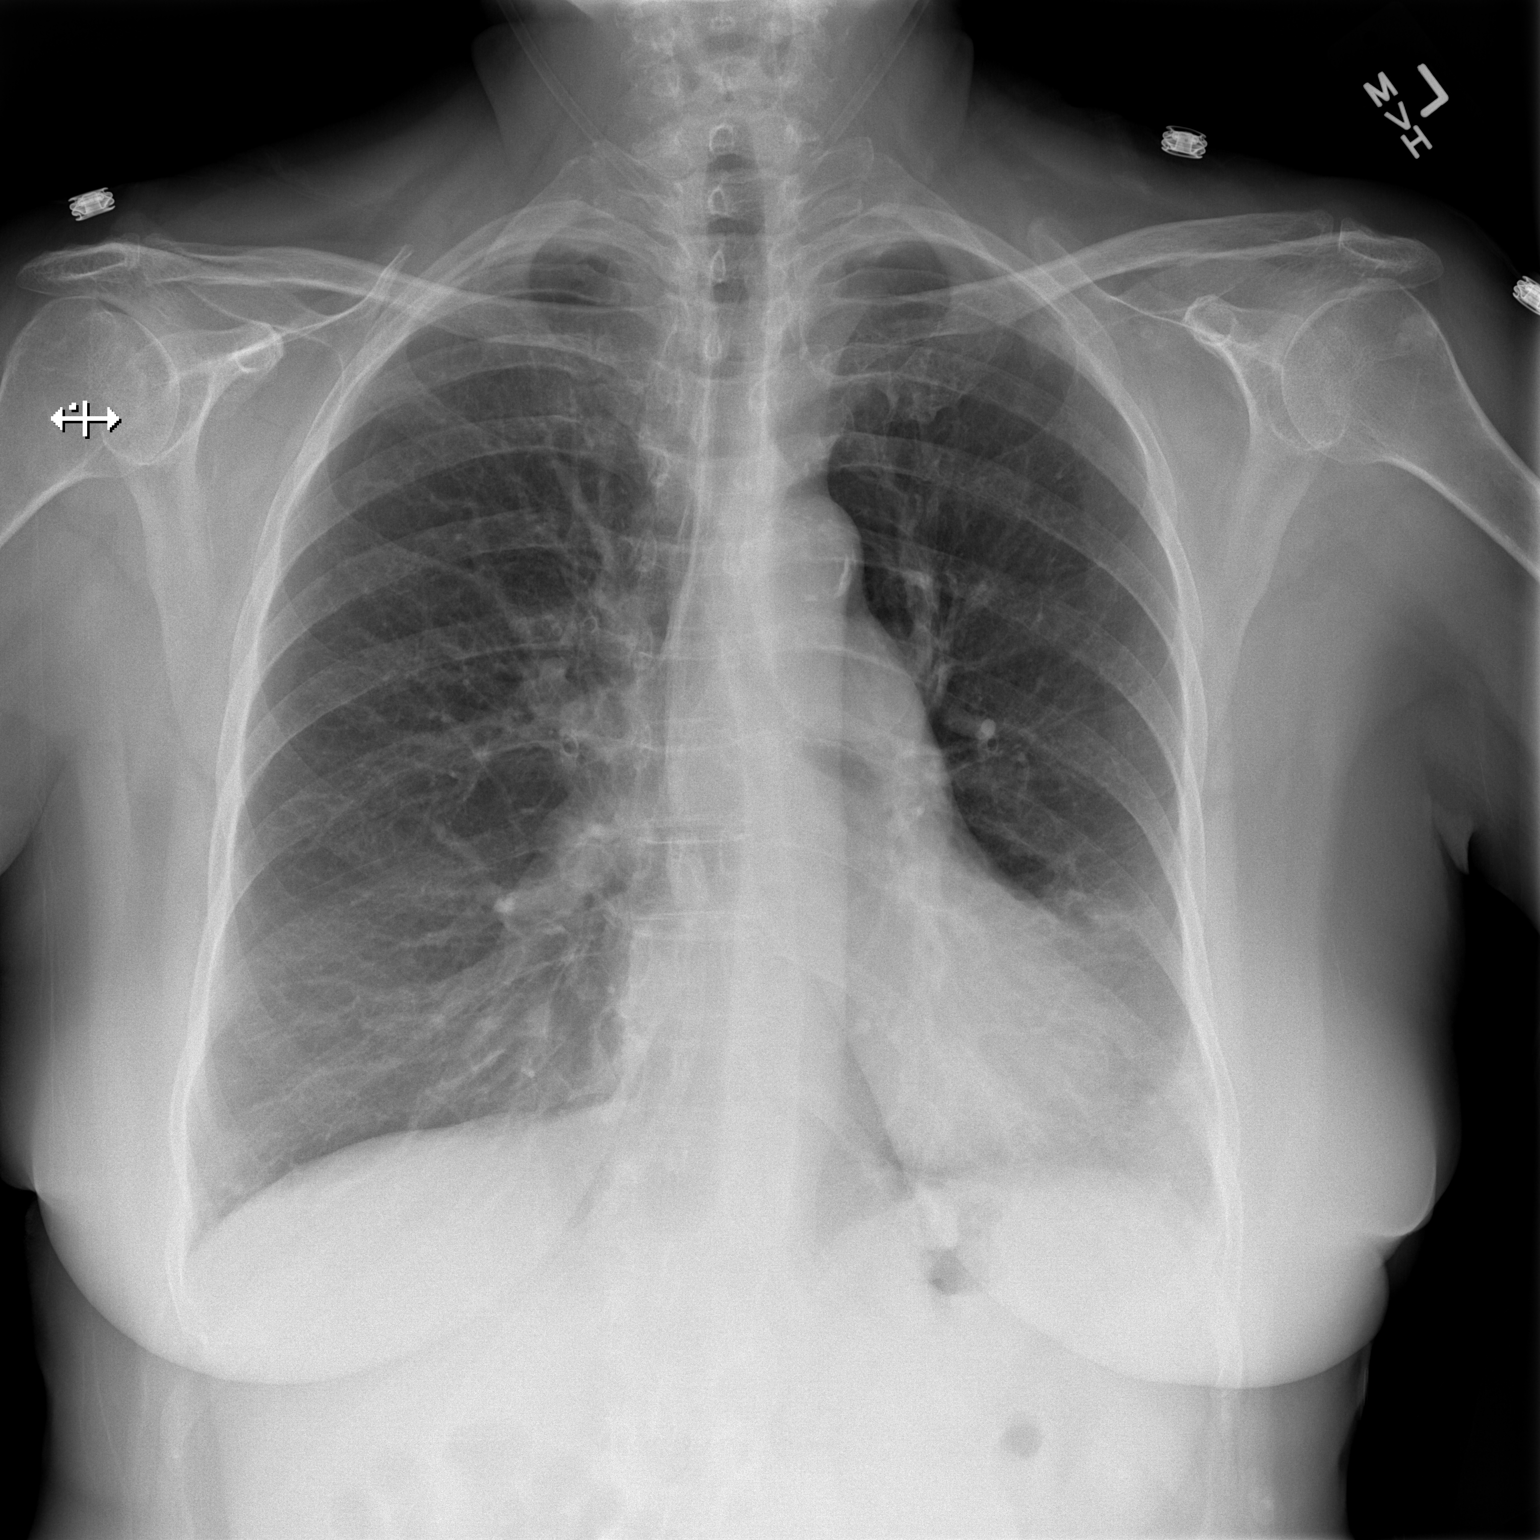

[w chest lat]
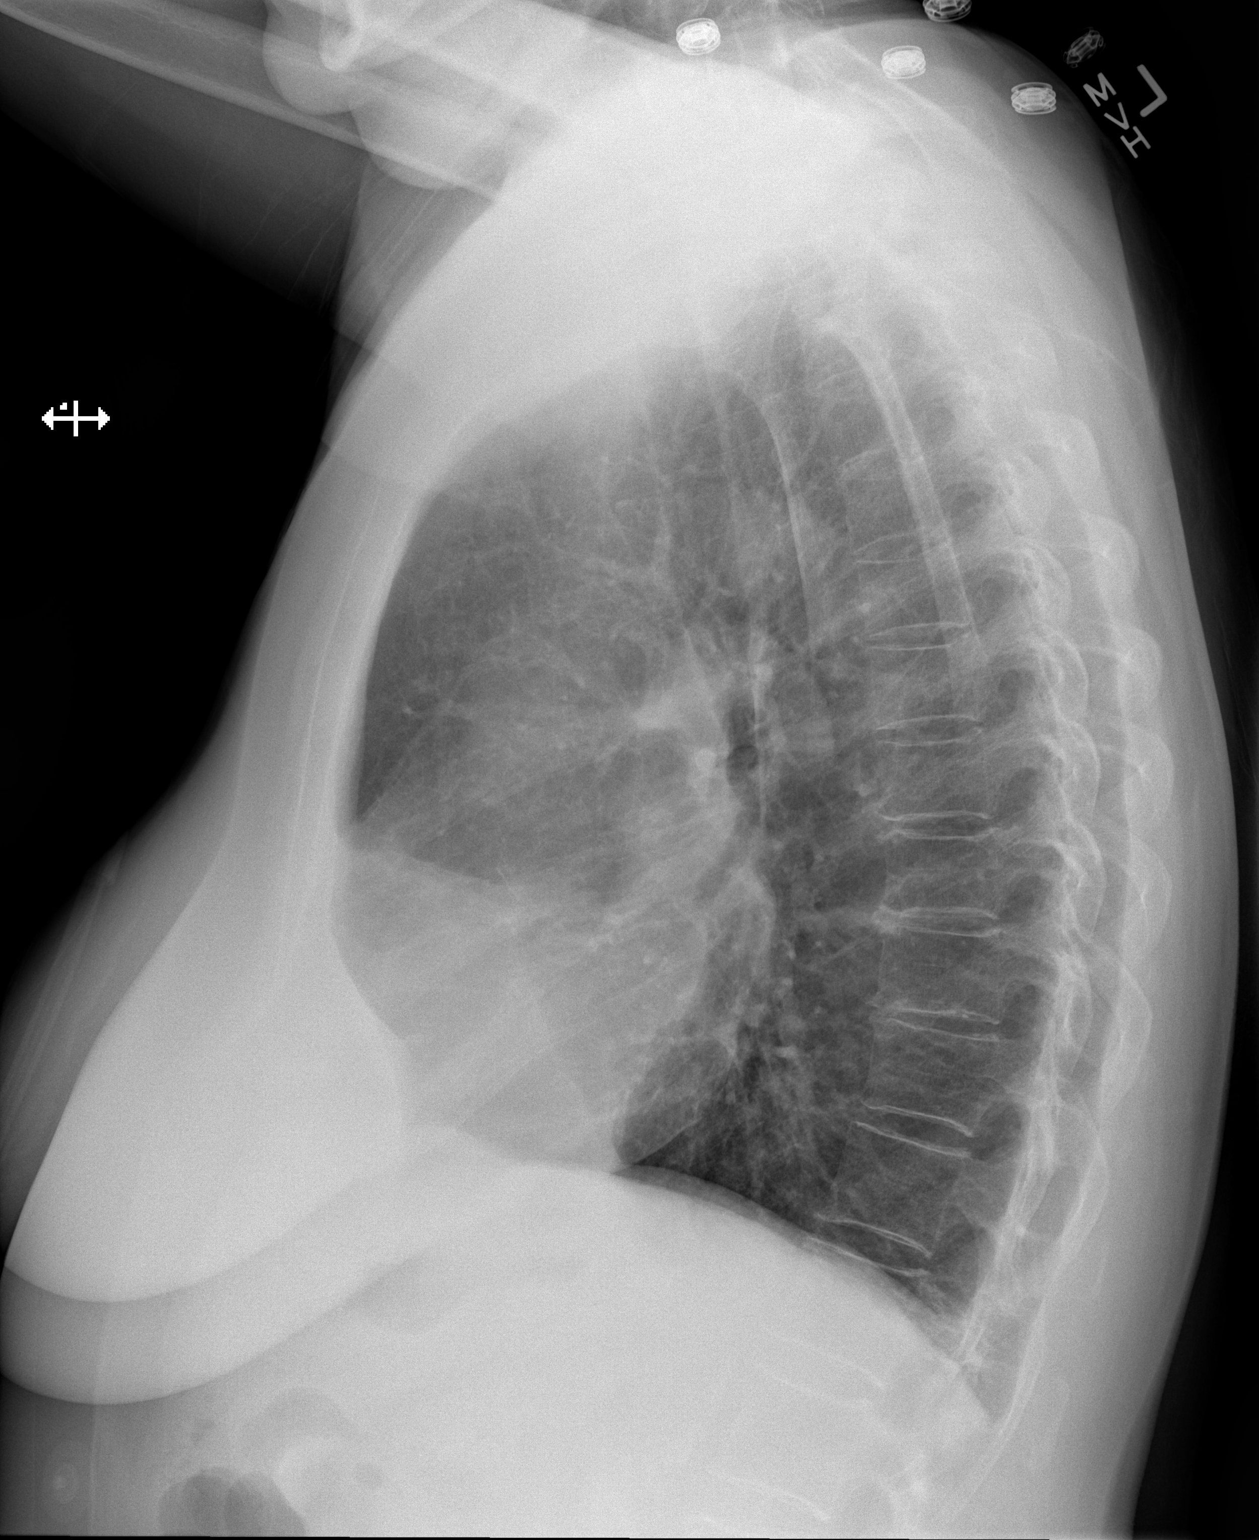

[2 of 2 positions shown; findings below may reference images not displayed]

FINDINGS: No active infiltrate or effusion is seen. Minimal scarring at the
left lung base is stable. Mediastinal and hilar contours are
unremarkable. The heart is borderline enlarged. No bony abnormality
is seen.
IMPRESSION: Stable scarring at the left lung base.  No definite active process.

## 2016-01-09 IMAGING — CT CT HEAD W/O CM
2 series · 15 of 30 positions shown, 19 images · non-contrast
Comparison: MRI brain 06/26/2012.  CT head 06/25/2012.

CLINICAL DATA: Acute mental status changes with confusion and
hallucinations.

EXAM:
CT HEAD WITHOUT CONTRAST
TECHNIQUE: Contiguous axial images were obtained from the base of the skull
through the vertex without intravenous contrast.

[Series 2: head w/o · axial · non-contrast · 0.45mm/px · z∈[+1668,+1788]mm · 13 of 28 slices shown, 17 images]
[im 2/28  brain]
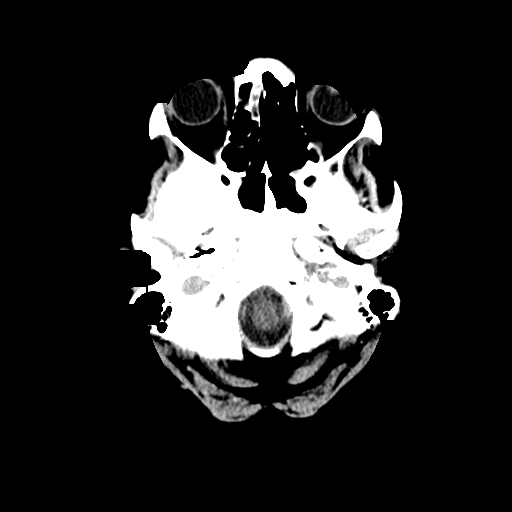
[im 2/28  bone]
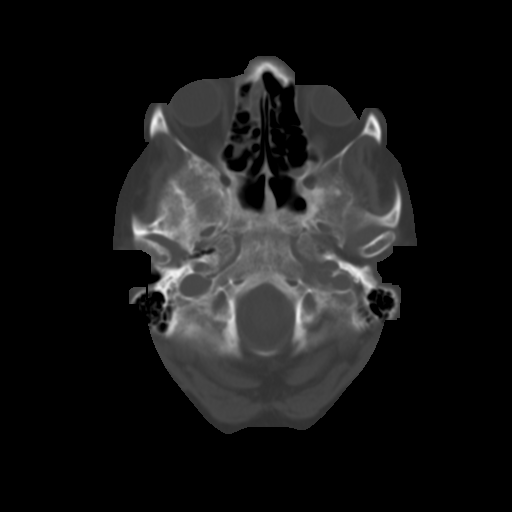
[im 4/28  brain]
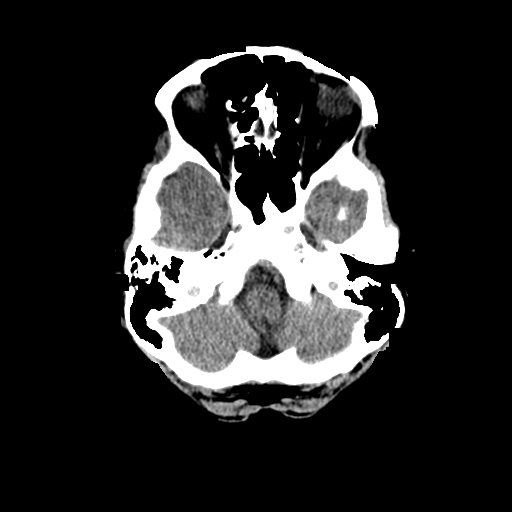
[im 6/28  brain]
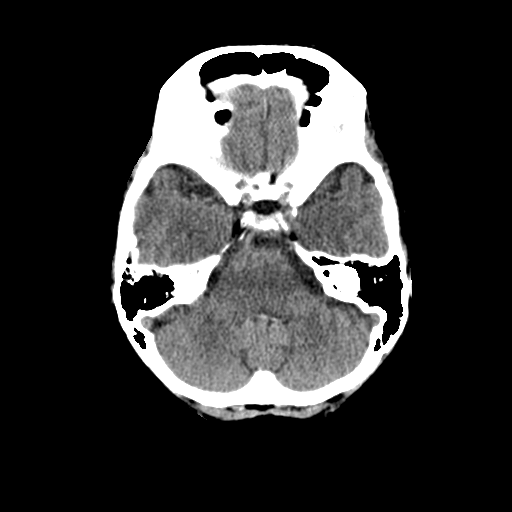
[im 8/28  brain]
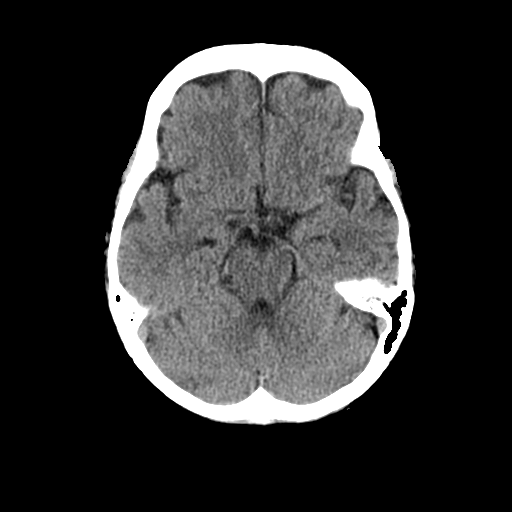
[im 10/28  brain]
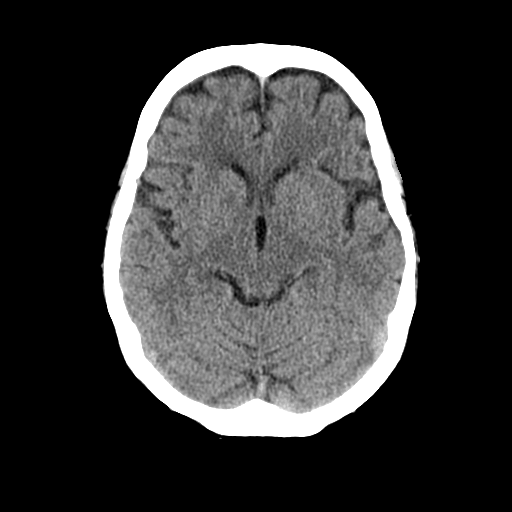
[im 10/28  bone]
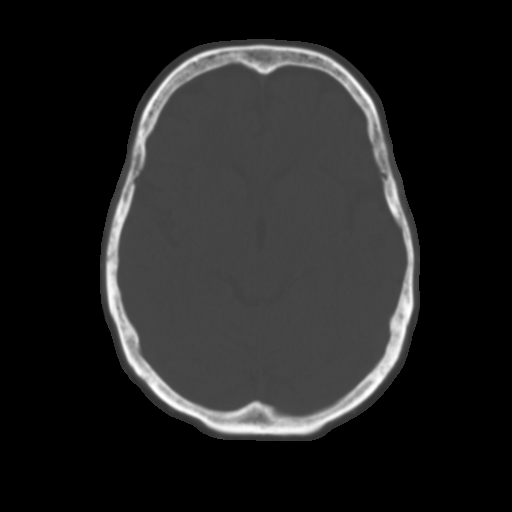
[im 12/28  brain]
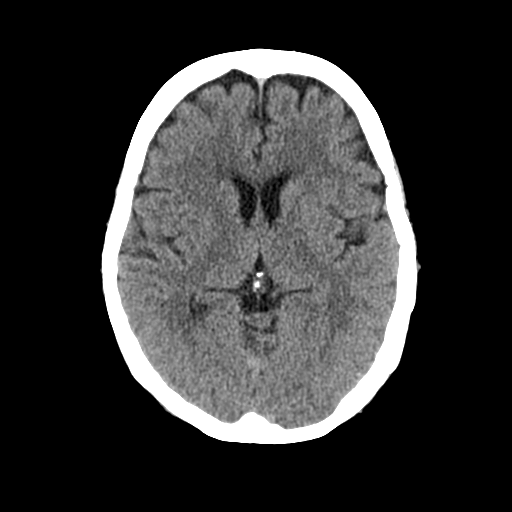
[im 14/28  brain]
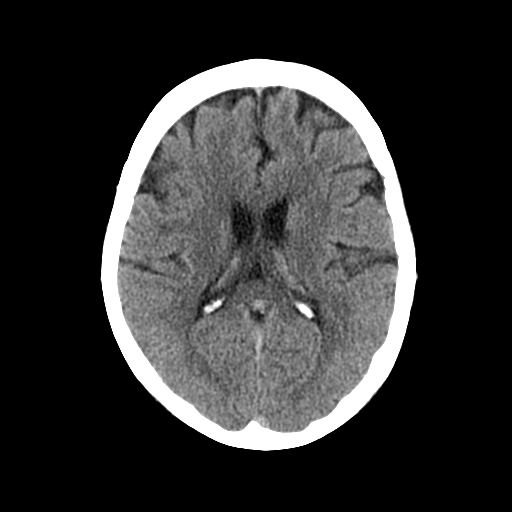
[im 16/28  brain]
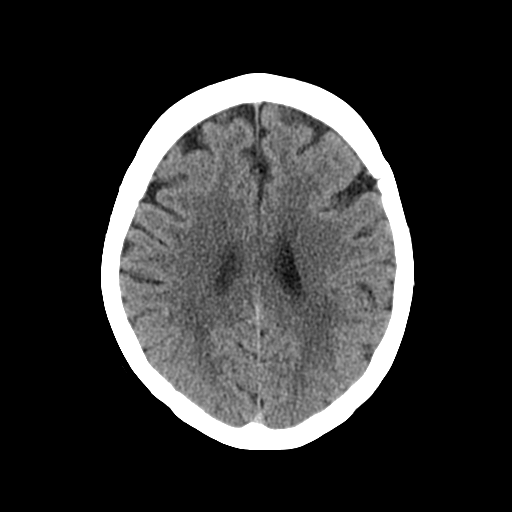
[im 18/28  brain]
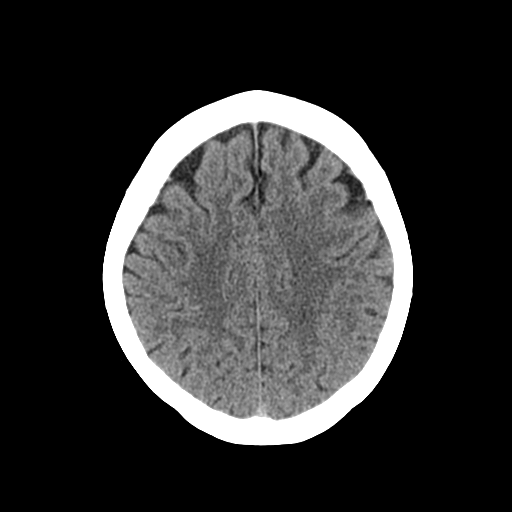
[im 18/28  bone]
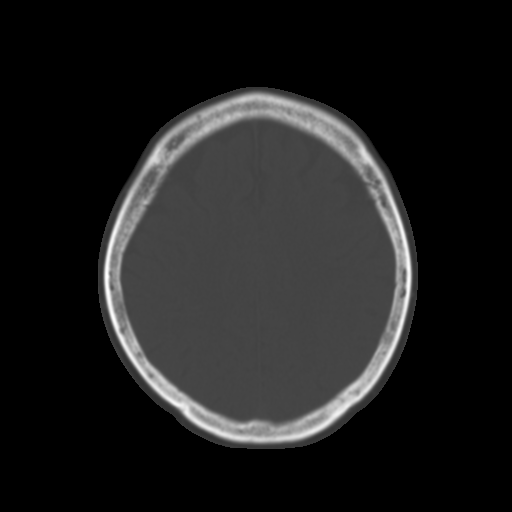
[im 20/28  brain]
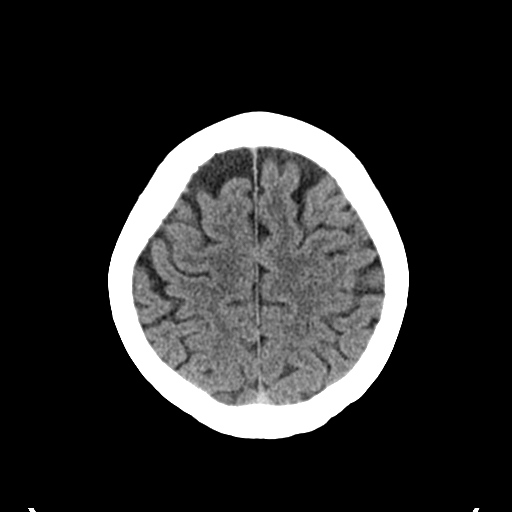
[im 22/28  brain]
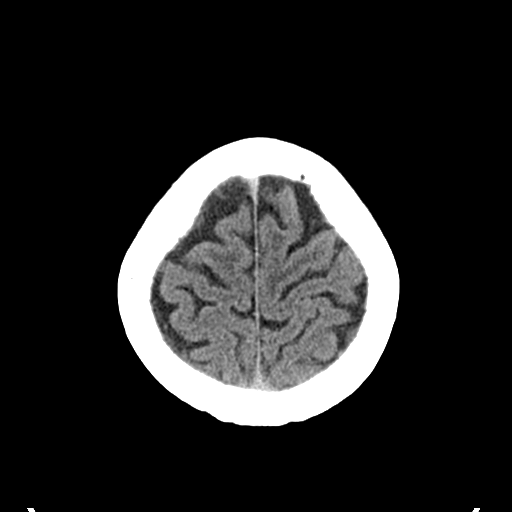
[im 24/28  brain]
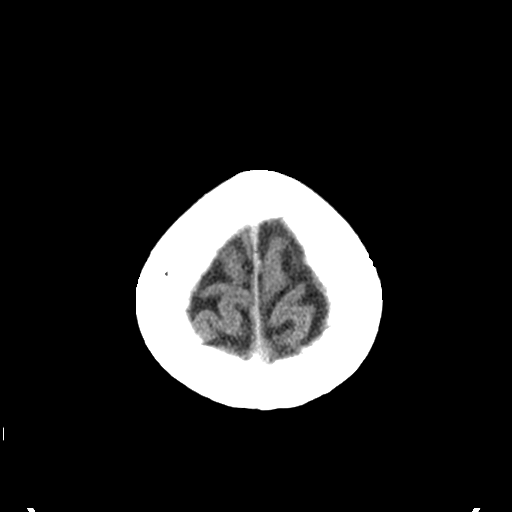
[im 26/28  brain]
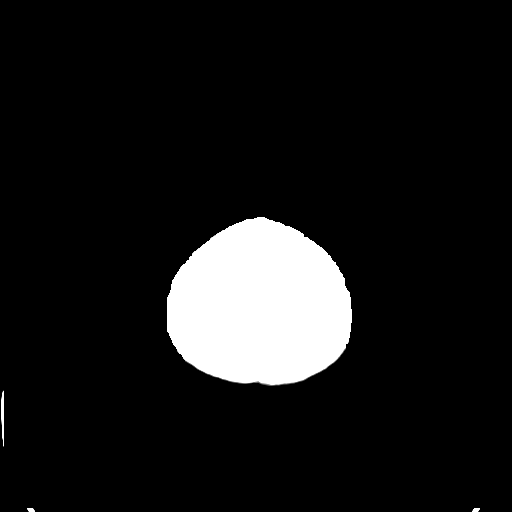
[im 26/28  bone]
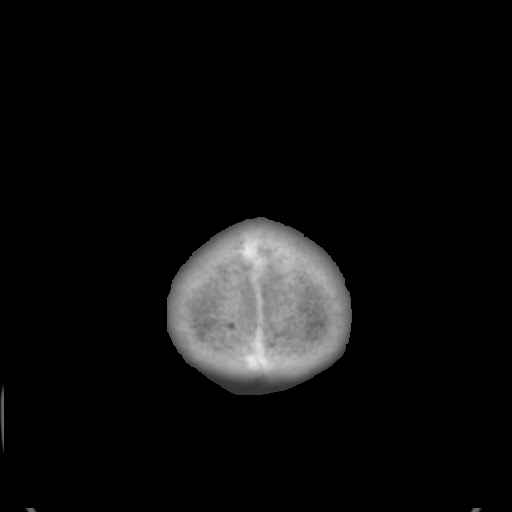

[Series 3: bone windows · axial · 0.45mm/px · z∈[+1668,+1688]mm · 2 of 28 slices shown]
[im 2/28  bone]
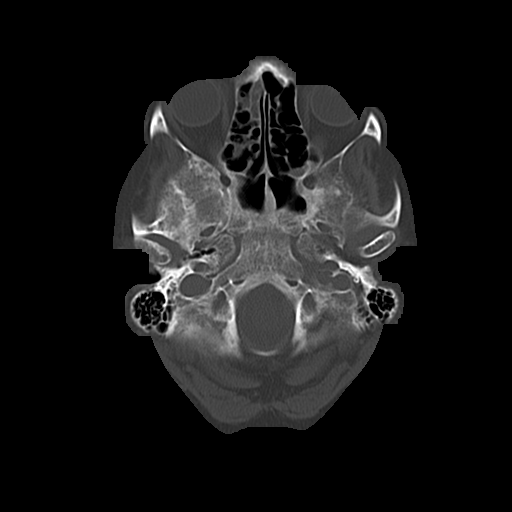
[im 6/28  bone]
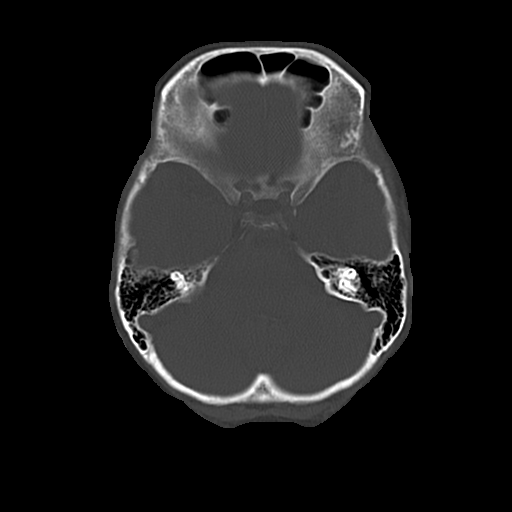

[15 of 30 positions shown; findings below may reference images not displayed]

FINDINGS: Ventricular system normal in size and appearance for age. Minimal
changes of small vessel disease of the white matter, unchanged. No
mass lesion. No midline shift. No acute hemorrhage or hematoma. No
extra-axial fluid collections. No evidence of acute infarction. No
significant interval change.

No skull fracture or other focal osseous abnormality involving the
skull. Opacification of right mastoid air cells and mucosal
thickening involving the visualized superior maxillary sinuses.
Bilateral mastoid air cells and bilateral middle ear cavities well
aerated. Mild bilateral carotid siphon atherosclerosis.
IMPRESSION: 1. No acute intracranial abnormality.
2. Stable minimal chronic microvascular ischemic changes of the
white matter.
3. Chronic right ethmoid and bilateral maxillary sinus disease.

## 2016-03-17 ENCOUNTER — Ambulatory Visit: Payer: Medicare Other | Admitting: Internal Medicine

## 2016-07-03 ENCOUNTER — Other Ambulatory Visit: Payer: Self-pay | Admitting: Internal Medicine

## 2016-07-05 ENCOUNTER — Other Ambulatory Visit: Payer: Self-pay | Admitting: *Deleted

## 2016-07-05 MED ORDER — METOPROLOL SUCCINATE ER 50 MG PO TB24: ORAL_TABLET | ORAL | 1 refills | Status: AC

## 2017-08-18 ENCOUNTER — Emergency Department (HOSPITAL_COMMUNITY): Payer: Medicare Other

## 2017-08-18 ENCOUNTER — Emergency Department (HOSPITAL_COMMUNITY)
Admission: EM | Admit: 2017-08-18 | Discharge: 2017-08-19 | Disposition: A | Discharge status: Home / Self Care | Payer: Medicare Other | Attending: Emergency Medicine | Admitting: Emergency Medicine

## 2017-08-18 ENCOUNTER — Encounter (HOSPITAL_COMMUNITY): Payer: Self-pay

## 2017-08-18 DIAGNOSIS — R531 Weakness: Secondary | ICD-10-CM | POA: Insufficient documentation

## 2017-08-18 DIAGNOSIS — R42 Dizziness and giddiness: Secondary | ICD-10-CM | POA: Insufficient documentation

## 2017-08-18 DIAGNOSIS — Z87891 Personal history of nicotine dependence: Secondary | ICD-10-CM | POA: Diagnosis not present

## 2017-08-18 DIAGNOSIS — R2689 Other abnormalities of gait and mobility: Secondary | ICD-10-CM | POA: Diagnosis present

## 2017-08-18 DIAGNOSIS — W010XXA Fall on same level from slipping, tripping and stumbling without subsequent striking against object, initial encounter: Secondary | ICD-10-CM | POA: Diagnosis not present

## 2017-08-18 DIAGNOSIS — Y939 Activity, unspecified: Secondary | ICD-10-CM | POA: Insufficient documentation

## 2017-08-18 DIAGNOSIS — I10 Essential (primary) hypertension: Secondary | ICD-10-CM | POA: Diagnosis not present

## 2017-08-18 DIAGNOSIS — Z79899 Other long term (current) drug therapy: Secondary | ICD-10-CM | POA: Insufficient documentation

## 2017-08-18 DIAGNOSIS — Y999 Unspecified external cause status: Secondary | ICD-10-CM | POA: Insufficient documentation

## 2017-08-18 LAB — URINALYSIS, ROUTINE W REFLEX MICROSCOPIC
Bilirubin Urine: NEGATIVE
Glucose, UA: NEGATIVE mg/dL
Hgb urine dipstick: NEGATIVE
Ketones, ur: NEGATIVE mg/dL
Leukocytes, UA: NEGATIVE
Nitrite: NEGATIVE
Protein, ur: NEGATIVE mg/dL
Specific Gravity, Urine: 1.004 — ABNORMAL LOW (ref 1.005–1.030)
pH: 6 (ref 5.0–8.0)

## 2017-08-18 LAB — CBC WITH DIFFERENTIAL/PLATELET
Abs Immature Granulocytes: 0 10*3/uL (ref 0.0–0.1)
Basophils Absolute: 0 10*3/uL (ref 0.0–0.1)
Basophils Relative: 1 %
Eosinophils Absolute: 0.1 10*3/uL (ref 0.0–0.7)
Eosinophils Relative: 2 %
HCT: 44.5 % (ref 36.0–46.0)
Hemoglobin: 14.7 g/dL (ref 12.0–15.0)
Immature Granulocytes: 1 %
Lymphocytes Relative: 24 %
Lymphs Abs: 1.9 10*3/uL (ref 0.7–4.0)
MCH: 31.5 pg (ref 26.0–34.0)
MCHC: 33 g/dL (ref 30.0–36.0)
MCV: 95.5 fL (ref 78.0–100.0)
Monocytes Absolute: 0.5 10*3/uL (ref 0.1–1.0)
Monocytes Relative: 7 %
Neutro Abs: 5.1 10*3/uL (ref 1.7–7.7)
Neutrophils Relative %: 65 %
Platelets: 261 10*3/uL (ref 150–400)
RBC: 4.66 MIL/uL (ref 3.87–5.11)
RDW: 12.3 % (ref 11.5–15.5)
WBC: 7.8 10*3/uL (ref 4.0–10.5)

## 2017-08-18 LAB — COMPREHENSIVE METABOLIC PANEL
ALT: 16 U/L (ref 14–54)
AST: 24 U/L (ref 15–41)
Albumin: 3.5 g/dL (ref 3.5–5.0)
Alkaline Phosphatase: 65 U/L (ref 38–126)
Anion gap: 11 (ref 5–15)
BUN: 7 mg/dL (ref 6–20)
CO2: 29 mmol/L (ref 22–32)
Calcium: 9.2 mg/dL (ref 8.9–10.3)
Chloride: 92 mmol/L — ABNORMAL LOW (ref 101–111)
Creatinine, Ser: 0.99 mg/dL (ref 0.44–1.00)
GFR calc Af Amer: >60 mL/min (ref >60)
GFR calc non Af Amer: 57 mL/min — ABNORMAL LOW (ref >60)
Glucose, Bld: 96 mg/dL (ref 65–99)
Potassium: 5.1 mmol/L (ref 3.5–5.1)
Sodium: 132 mmol/L — ABNORMAL LOW (ref 135–145)
Total Bilirubin: 0.5 mg/dL (ref 0.3–1.2)
Total Protein: 5.8 g/dL — ABNORMAL LOW (ref 6.5–8.1)

## 2017-08-18 LAB — RAPID URINE DRUG SCREEN, HOSP PERFORMED
Amphetamines: NOT DETECTED
Benzodiazepines: POSITIVE — AB
Cocaine: NOT DETECTED
Opiates: NOT DETECTED
Tetrahydrocannabinol: NOT DETECTED

## 2017-08-18 LAB — ETHANOL: Alcohol, Ethyl (B): <10 mg/dL (ref <10)

## 2017-08-18 NOTE — ED Notes (Signed)
Delay in lab draw,  Pt currently not in hallway bed.

## 2017-08-18 NOTE — ED Notes (Signed)
ED Provider at bedside. 

## 2017-08-18 NOTE — ED Notes (Signed)
Patient walked in hallway with RN; patient is very unsteady on her feet; pt denies dizziness but states she has some weakness; RN not able to take hands off of patient ambulating due to [facility]

## 2017-08-18 NOTE — ED Triage Notes (Signed)
Per EMS patient is from home and had a fall today with previous falls noted; EMS states patient hit head but patient denies hitting head or LOC; Patient c/o dizziness at times with some weakness but denies current; patient is A&Ox 4 but sounds confused at times; Patient is not on thinners and neg stroke screen for EMS; No family present on arrival but patient keeps speaking of husband being here but has not been seen by staff yet-Monique,RN

## 2017-08-19 LAB — AMMONIA: Ammonia: 27 umol/L (ref 9–35)

## 2017-08-19 NOTE — ED Notes (Signed)
Delay in lab draw pt not in hallway bed at this time.

## 2017-08-19 NOTE — ED Notes (Signed)
Per lab, ammonia will result in 15 to 20 mins.

## 2017-08-19 NOTE — ED Provider Notes (Addendum)
[facility] EMERGENCY DEPARTMENT Provider Note   CSN: [id number] Arrival date & time: 08/18/17  1907     History   Chief Complaint Chief Complaint  Patient presents with  . Fall    HPI [name] [name] is a 67 y.o. female.  HPI Patient presents to the emergency department telling a fall that occurred earlier today.  The patient states that she felt a little bit off balance.  And she states that she fell landing on her buttocks.  Patient states that she has been taking a medication that makes her drowsy and somewhat loopy.  This medication is for her esophageal spasms.  The patient denies chest pain, shortness of breath, headache,blurred vision, neck pain, fever, cough, weakness, numbness, , anorexia, edema, abdominal pain, nausea, vomiting, diarrhea, rash, back pain, dysuria, hematemesis, bloody stool, near syncope, or syncope. Past Medical History:  Diagnosis Date  . Anxiety disorder   . Cervical disc disease   . Chronic headache   . Chronic pain syndrome   . COPD (chronic obstructive pulmonary disease) ([place])   . DJD (degenerative joint disease)   . Fibromyalgia   . GERD (gastroesophageal reflux disease)   . Hypercholesteremia   . Hypertension   . IBS (irritable bowel syndrome)   . Vitamin D deficiency     Patient Active Problem List   Diagnosis Date Noted  . 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 Depression 12/25/2012  . HOCM (hypertrophic obstructive cardiomyopathy) ([place]) 06/28/2012  . Acute on chronic respiratory failure ([place]) 06/27/2012  . 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 Pulmonary hypertension due to chronic obstructive pulmonary disease ([place]) 04/17/2009  . VITAMIN D DEFICIENCY 06/20/2008  . Anxiety state 07/21/2007  . Chronic obstructive airway disease with asthma ([place]) 07/21/2007  . Irritable bowel syndrome 07/21/2007  . Fibromyalgia 07/21/2007  . HYPERCHOLESTEROLEMIA 01/19/2007  . Chronic pain syndrome 01/19/2007  . Essential  hypertension 01/19/2007  . GERD 01/19/2007    Past Surgical History:  Procedure Laterality Date  . ANTERIOR CERVICAL DECOMP/DISCECTOMY FUSION  2002  . CARPAL TUNNEL RELEASE     bilateral  . VAGINAL HYSTERECTOMY  age 21  . VESICOVAGINAL FISTULA CLOSURE W/ TAH  2001     OB History    Gravida  2   Para  2   Term  2   Preterm      AB      Living  2     SAB      TAB      Ectopic      Multiple      Live Births               Home Medications    Prior to Admission medications   Medication Sig Start Date End Date Taking? Authorizing Provider  albuterol (PROVENTIL) (2.5 MG/3ML) 0.083% nebulizer solution Take 3 mLs (2.5 mg total) by nebulization 3 (three) times daily. DX 493.20 05/02/14  Yes Hoyt Koch, MD  ANORO ELLIPTA 62.5-25 MCG/INH AEPB Inhale 1 puff into the lungs daily.  08/10/17  Yes [provider]  buPROPion (WELLBUTRIN XL) 300 MG 24 hr tablet Take 300 mg by mouth daily. 08/10/17  Yes [provider]  busPIRone (BUSPAR) 30 MG tablet Take 30 mg by mouth 2 (two) times daily. 07/14/17  Yes [provider]  Cholecalciferol (VITAMIN D) 2000 UNITS CAPS Take 2 capsules by mouth daily.    Yes [provider]  diazepam (VALIUM) 5 MG tablet Take 5 mg by  mouth 3 (three) times daily as needed for anxiety.  07/28/17  Yes [provider]  DULoxetine (CYMBALTA) 30 MG capsule Take 30 mg by mouth daily.  09/06/13  Yes [provider]  metoprolol succinate (TOPROL-XL) 50 MG 24 hr tablet TAKE ONE TABLET BY MOUTH ONCE DAILY WITH  OR  IMMEDIATELY  FOLLOWING  A  MEAL. 07/05/16  Yes Hoyt Koch, MD  pantoprazole (PROTONIX) 20 MG tablet Take 20 mg by mouth daily.   Yes [provider]  spironolactone (ALDACTONE) 25 MG tablet Take 0.5 tablets (12.5 mg total) by mouth daily. 12/03/14  Yes Bensimhon, Shaune Pascal, MD  traZODone (DESYREL) 100 MG tablet Take 100 mg by mouth at bedtime. 08/10/17  Yes [provider]    albuterol (PROAIR HFA) 108 (90 BASE) MCG/ACT inhaler Inhale 2 puffs into the lungs every 4 (four) hours as needed for wheezing. Patient not taking: Reported on 12/16/2015 04/08/14   Noralee Space, MD  doxycycline (VIBRA-TABS) 100 MG tablet Take 1 tablet (100 mg total) by mouth 2 (two) times daily. Patient not taking: Reported on 08/18/2017 12/16/15   Hoyt Koch, MD  Naltrexone-Bupropion HCl ER 8-90 MG TB12 Week 1 take 1 pill daily, week 2 take 1 pill twice a day, week 3 take 2 pills in the morning and 1 pill at night, week 4 and beyond take 2 pills in the morning and 2 pills at night Patient not taking: Reported on 12/16/2015 06/16/15   Hoyt Koch, MD  omeprazole (PRILOSEC) 20 MG capsule Take 1 capsule (20 mg total) by mouth 2 (two) times daily. Patient not taking: Reported on 12/16/2015 06/16/15   Hoyt Koch, MD  rosuvastatin (CRESTOR) 20 MG tablet Take 1 tablet (20 mg total) by mouth daily. Patient not taking: Reported on 08/18/2017 06/16/15   Hoyt Koch, MD    Family History Family History  Problem Relation Age of Onset  . Heart disease Mother   . Other Father        asbestos exposure    Social History Social History   Tobacco Use  . Smoking status: Former Smoker    Packs/day: 1.00    Years: 30.00    Pack years: 30.00    Types: Cigarettes    Last attempt to quit: 07/11/2009    Years since quitting: 8.1  . Smokeless tobacco: Former Systems developer    Quit date: 07/11/2009  Substance Use Topics  . Alcohol use: No    Alcohol/week: 0.0 oz  . Drug use: No     Allergies   Patient has no known allergies.   Review of Systems Review of Systems All other systems negative except as documented in the HPI. All pertinent positives and negatives as reviewed in the HPI.   Physical Exam Updated Vital Signs BP 121/75 (BP Location: Right Arm)   Temp 98.9 F (37.2 C) (Oral)   Resp 18   SpO2 98%   Physical Exam  Constitutional: She is oriented to  person, place, and time. She appears well-developed and well-nourished. No distress.  HENT:  Head: Normocephalic and atraumatic.  Mouth/Throat: Oropharynx is clear and moist.  Eyes: Pupils are equal, round, and reactive to light.  Neck: Normal range of motion. Neck supple.  Cardiovascular: Normal rate, regular rhythm and normal heart sounds. Exam reveals no gallop and no friction rub.  No murmur heard. Pulmonary/Chest: Effort normal and breath sounds normal. No respiratory distress. She has no wheezes.  Abdominal: Soft. Bowel sounds are  normal. She exhibits no distension. There is no tenderness.  Neurological: She is alert and oriented to person, place, and time. She exhibits normal muscle tone. Coordination normal.  Skin: Skin is warm and dry. Capillary refill takes less than 2 seconds. No rash noted. No erythema.  Psychiatric: She has a normal mood and affect. Her behavior is normal.  Nursing note and vitals reviewed.    ED Treatments / Results  Labs (all labs ordered are listed, but only abnormal results are displayed) Labs Reviewed  COMPREHENSIVE METABOLIC PANEL - Abnormal; Notable for the following components:      Result Value   Sodium 132 (*)    Chloride 92 (*)    Total Protein 5.8 (*)    GFR calc non Af Amer 57 (*)    All other components within normal limits  RAPID URINE DRUG SCREEN, HOSP PERFORMED - Abnormal; Notable for the following components:   Benzodiazepines POSITIVE (*)    Barbiturates   (*)    Value: Result not available. Reagent lot number recalled by manufacturer.   All other components within normal limits  URINALYSIS, ROUTINE W REFLEX MICROSCOPIC - Abnormal; Notable for the following components:   Color, Urine STRAW (*)    Specific Gravity, Urine 1.004 (*)    All other components within normal limits  CBC WITH DIFFERENTIAL/PLATELET  ETHANOL  AMMONIA    EKG None  Radiology Dg Chest 2 View  Result Date: 08/18/2017 CLINICAL DATA:  Cough EXAM: CHEST -  2 VIEW COMPARISON:  Chest radiograph 03/23/2014 FINDINGS: Bibasilar atelectasis. Mild cardiomegaly is unchanged. No focal consolidation. No pulmonary edema, pleural effusion or pneumothorax. IMPRESSION: Basilar atelectasis without acute airspace disease. Electronically Signed   By: Ulyses Jarred M.D.   On: 08/18/2017 23:47   Ct Head Wo Contrast  Result Date: 08/18/2017 CLINICAL DATA:  Fall today and hit head.  Dizziness. EXAM: CT HEAD WITHOUT CONTRAST TECHNIQUE: Contiguous axial images were obtained from the base of the skull through the vertex without intravenous contrast. COMPARISON:  03/21/2014 FINDINGS: Brain: Ventricles, cisterns and other CSF spaces are normal. There is no mass, mass effect, shift of midline structures or acute hemorrhage. There is mild chronic ischemic microvascular disease. No evidence of acute infarction. Vascular: No hyperdense vessel or unexpected calcification. Skull: Normal. Negative for fracture or focal lesion. Sinuses/Orbits: Orbits are normal. There is opacification over the ethmoid and sphenoid sinuses with air-fluid levels over the maxillary sinuses. Mastoid air cells are clear. Other: None. IMPRESSION: No acute brain injury. Chronic ischemic microvascular disease. Inflammatory change of the sinuses with possible acute component over the maxillary sinuses. Electronically Signed   By: Marin Olp M.D.   On: 08/18/2017 20:45    Procedures Procedures (including critical care time)  Medications Ordered in ED Medications - No data to display   Initial Impression / Assessment and Plan / ED Course  I have reviewed the triage vital signs and the nursing notes.  Pertinent labs & imaging results that were available during my care of the patient were reviewed by me and considered in my medical decision making (see chart for details).     The patient is ambulated without any difficulties.  I feel that the patient does have some reactions to medication that she is been  taking is making her drowsy and somewhat disoriented.  Husband states that she is potentially taken an extra dose too soon.  Patient is awaiting ammonia level otherwise her laboratory testing and MRI does not show any significant  abnormality.  Final Clinical Impressions(s) / ED Diagnoses   Final diagnoses:  None    ED Discharge Orders    None     Patient does not have any focal neurological deficits and she is alert and oriented x3.  The patient does have a history of COPD and she states she wheezes chronically.  Patient states that her sensations she can relate more to the medication she has been taking she has had a history of alcohol abuse in the past.  Her alcohol level was negative tonight.  We are awaiting an ammonia level which Dr. Stark Jock will follow up on.  The patient's MRI was negative and was done because the patient had such a vague history of this dizziness/off-balance sensation.  Patient is advised of her testing is negative she will be discharged home and will need follow-up with her primary doctor.  The patient's vital signs remained stable here in the emergency department.  Patient and husband agree to the plan and all questions were answered. Dalia Heading, PA-C 08/19/17 0125    Dalia Heading, PA-C 08/20/17 0028    Macarthur Critchley, MD 08/22/17 [phone]

## 2017-08-19 NOTE — Discharge Instructions (Signed)
Return here as needed.  Follow-up with your doctor as soon as possible.  I would advise cutting back on the medication that has made you drowsy.
